# Patient Record
Sex: Female | Born: 1960 | Race: White | Hispanic: No | Marital: Married | Smoking: Former smoker
Health system: Southern US, Community
[De-identification: ages and names within clinical notes are randomized; demographics above are authoritative.]

## PROBLEM LIST (undated history)

## (undated) DIAGNOSIS — R000 Tachycardia, unspecified: Secondary | ICD-10-CM

## (undated) DIAGNOSIS — K859 Acute pancreatitis without necrosis or infection, unspecified: Secondary | ICD-10-CM

## (undated) DIAGNOSIS — I454 Nonspecific intraventricular block: Secondary | ICD-10-CM

---
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
## 2000-02-18 ENCOUNTER — Emergency Department (HOSPITAL_COMMUNITY): Admission: EM | Admit: 2000-02-18 | Discharge: 2000-02-18 | Payer: Self-pay | Admitting: Emergency Medicine

## 2004-12-24 ENCOUNTER — Emergency Department (HOSPITAL_COMMUNITY): Admission: EM | Admit: 2004-12-24 | Discharge: 2004-12-24 | Payer: Self-pay | Admitting: Emergency Medicine

## 2004-12-25 ENCOUNTER — Emergency Department (HOSPITAL_COMMUNITY): Admission: EM | Admit: 2004-12-25 | Discharge: 2004-12-25 | Payer: Self-pay | Admitting: Emergency Medicine

## 2005-06-04 HISTORY — PX: KNEE SURGERY: SHX244

## 2005-06-23 ENCOUNTER — Emergency Department: Payer: Self-pay | Admitting: Emergency Medicine

## 2005-06-24 ENCOUNTER — Other Ambulatory Visit: Payer: Self-pay

## 2005-06-25 ENCOUNTER — Ambulatory Visit: Payer: Self-pay | Admitting: Emergency Medicine

## 2005-11-20 ENCOUNTER — Ambulatory Visit: Payer: Self-pay | Admitting: Specialist

## 2005-12-14 ENCOUNTER — Ambulatory Visit (HOSPITAL_COMMUNITY): Admission: RE | Admit: 2005-12-14 | Discharge: 2005-12-14 | Payer: Self-pay | Admitting: Orthopaedic Surgery

## 2005-12-18 ENCOUNTER — Encounter: Payer: Self-pay | Admitting: Orthopaedic Surgery

## 2006-01-02 ENCOUNTER — Encounter: Payer: Self-pay | Admitting: Orthopaedic Surgery

## 2015-12-30 ENCOUNTER — Encounter: Payer: Self-pay | Admitting: Family Medicine

## 2015-12-30 ENCOUNTER — Ambulatory Visit (INDEPENDENT_AMBULATORY_CARE_PROVIDER_SITE_OTHER): Payer: BLUE CROSS/BLUE SHIELD | Admitting: Family Medicine

## 2015-12-30 VITALS — BP 120/84 | HR 70 | Temp 97.7°F | Resp 16 | Ht 66.0 in | Wt 227.4 lb

## 2015-12-30 DIAGNOSIS — S61259A Open bite of unspecified finger without damage to nail, initial encounter: Secondary | ICD-10-CM | POA: Diagnosis not present

## 2015-12-30 DIAGNOSIS — M26629 Arthralgia of temporomandibular joint, unspecified side: Secondary | ICD-10-CM | POA: Insufficient documentation

## 2015-12-30 DIAGNOSIS — E785 Hyperlipidemia, unspecified: Secondary | ICD-10-CM | POA: Insufficient documentation

## 2015-12-30 DIAGNOSIS — W540XXA Bitten by dog, initial encounter: Principal | ICD-10-CM

## 2015-12-30 DIAGNOSIS — K529 Noninfective gastroenteritis and colitis, unspecified: Secondary | ICD-10-CM | POA: Insufficient documentation

## 2015-12-30 DIAGNOSIS — Z23 Encounter for immunization: Secondary | ICD-10-CM | POA: Diagnosis not present

## 2015-12-30 MED ORDER — CEFTRIAXONE SODIUM 500 MG IJ SOLR
500.0000 mg | Freq: Once | INTRAMUSCULAR | Status: AC
  Administered 2015-12-30: 500 mg via INTRAMUSCULAR

## 2015-12-30 MED ORDER — AMOXICILLIN-POT CLAVULANATE 875-125 MG PO TABS: 1.0000 | ORAL_TABLET | Freq: Two times a day (BID) | ORAL | 0 refills | Status: DC

## 2015-12-30 NOTE — Patient Instructions (Addendum)
Continue salt water soaks. If not improving or you develop fever/chills over the next 24-48 hours report to the ER. You have received a Rocephin shot 1 gram today.

## 2015-12-30 NOTE — Progress Notes (Addendum)
Subjective:     Patient ID: Vanessa Rhodes, female   DOB: 1960-07-23, 55 y.o.   MRN: [id number]  HPI  Chief Complaint  Patient presents with  . Animal Bite    Patient comes in office today with concerns of dog bite to the right hand. Patient states that injury occured yesterday morning when she was trying to tell her dog "no" when he tried to eat her cats food. Patient reports that her index finger where bite was is swollen and draining, last report  Td/Tdap was 76yrs ago.   States she has soaked her finger in salt water and applied antibiotic cream with little improvement. Reports continued pain especially with flexion. She is right handed. Works as the Software engineer at [employer].   Review of Systems     Objective:   Physical Exam  Constitutional: She appears well-developed and well-nourished. No distress.  Skin:  Right third finger with laceration/bite over her PIP joint with erythema and swelling. Has puncture wound on the ventral aspect of this finger. Can flex finger to 45 degrees before increased pain and stiffness/ + full extension.       Assessment:    1. Dog bite of finger, initial encounter - amoxicillin-clavulanate (AUGMENTIN) 875-125 MG tablet; Take 1 tablet by mouth 2 (two) times daily.  Dispense: 20 tablet; Refill: 0 - cefTRIAXone (ROCEPHIN) injection 500 mg; Inject 500 mg into the muscle once. - cefTRIAXone (ROCEPHIN) injection 500 mg; Inject 500 mg into the muscle once.  2. Need for diphtheria-tetanus-pertussis (Tdap) vaccine - Tdap vaccine greater than or equal to 7yo IM    Plan:    She is to report to the ER if not improving in the next 24-48 hours.

## 2016-01-02 ENCOUNTER — Telehealth: Payer: Self-pay | Admitting: Family Medicine

## 2016-01-02 NOTE — Telephone Encounter (Signed)
Pt stated that she had received a message from [employer] asking how her wound from a dog bite was healing. Pt stated that there is still some puss coming out of the wound and it is still painful. Pt stated that it has improved some and she continues to take medication and care for the wound as directed. Pt stated to let her know if she should schedule a follow up visit. Please advise. Thanks TNP

## 2016-01-02 NOTE — Telephone Encounter (Signed)
Yes, come back tomorrow, 8/1.

## 2016-01-02 NOTE — Telephone Encounter (Signed)
Called pt to schedule appt with Nadine Counts tomorrow. No answer. LMTCB. Thanks TNP

## 2016-01-02 NOTE — Telephone Encounter (Signed)
Pt returned call and scheduled appt for tomorrow at 8:30am. Thanks TNP

## 2016-01-03 ENCOUNTER — Telehealth: Payer: Self-pay

## 2016-01-03 ENCOUNTER — Ambulatory Visit (INDEPENDENT_AMBULATORY_CARE_PROVIDER_SITE_OTHER): Payer: BLUE CROSS/BLUE SHIELD | Admitting: Family Medicine

## 2016-01-03 ENCOUNTER — Encounter: Payer: Self-pay | Admitting: Family Medicine

## 2016-01-03 ENCOUNTER — Ambulatory Visit
Admission: RE | Admit: 2016-01-03 | Discharge: 2016-01-03 | Disposition: A | Discharge status: Home / Self Care | Payer: BLUE CROSS/BLUE SHIELD | Source: Ambulatory Visit | Attending: Family Medicine | Admitting: Family Medicine

## 2016-01-03 VITALS — BP 126/80 | HR 82 | Temp 97.9°F | Resp 16 | Wt 227.8 lb

## 2016-01-03 DIAGNOSIS — X58XXXD Exposure to other specified factors, subsequent encounter: Secondary | ICD-10-CM | POA: Diagnosis not present

## 2016-01-03 DIAGNOSIS — S61259D Open bite of unspecified finger without damage to nail, subsequent encounter: Secondary | ICD-10-CM

## 2016-01-03 DIAGNOSIS — W540XXD Bitten by dog, subsequent encounter: Principal | ICD-10-CM

## 2016-01-03 NOTE — Telephone Encounter (Signed)
[id number] 

## 2016-01-03 NOTE — Patient Instructions (Signed)
Continue salt water soaks and dressing. We will call you with the x-ray report,

## 2016-01-03 NOTE — Progress Notes (Signed)
Subjective:     Patient ID: Vanessa Rhodes, female   DOB: July 25, 1960, 55 y.o.   MRN: [id number]  HPI  Chief Complaint  Patient presents with  . Animal Bite    Patient returns to office today for follow up visit, patient was last seen 7/28 for dog bite to her right hand (middle finger). Patient was prescribed Augmentin 875-125 she reports good compliance and fair tolerance on medication, today patient reports that her finger is still painful and has white drainage.   States she removes the dressing at night.   Review of Systems     Objective:   Physical Exam  Constitutional: She appears well-developed and well-nourished. No distress.  Skin:  Right middle finger over her PIP with less swelling, decreased erythema, and no drainage noted.       Assessment:    1. Dog bite of finger, subsequent encounter:skin improving but due to continued pain will check for signs of osteomyelitis - DG Finger Middle Right; Future    Plan:    Continue current treatment with salt water soaks, dressings, and abx pending x-ray report.

## 2016-01-03 NOTE — Telephone Encounter (Signed)
Patient has been advised. KW 

## 2016-01-03 NOTE — Telephone Encounter (Signed)
-----   Message from Anola Gurney, [place] sent at 01/03/2016  9:45 AM EDT ----- X-ray ok. Continue current treatment plan

## 2016-01-20 ENCOUNTER — Ambulatory Visit (INDEPENDENT_AMBULATORY_CARE_PROVIDER_SITE_OTHER): Payer: BLUE CROSS/BLUE SHIELD | Admitting: Family Medicine

## 2016-01-20 ENCOUNTER — Encounter: Payer: Self-pay | Admitting: Family Medicine

## 2016-01-20 VITALS — BP 130/80 | HR 76 | Temp 97.4°F | Resp 16 | Wt 227.6 lb

## 2016-01-20 DIAGNOSIS — M545 Low back pain, unspecified: Secondary | ICD-10-CM

## 2016-01-20 DIAGNOSIS — R220 Localized swelling, mass and lump, head: Secondary | ICD-10-CM | POA: Diagnosis not present

## 2016-01-20 DIAGNOSIS — R42 Dizziness and giddiness: Secondary | ICD-10-CM

## 2016-01-20 DIAGNOSIS — Z131 Encounter for screening for diabetes mellitus: Secondary | ICD-10-CM

## 2016-01-20 DIAGNOSIS — E785 Hyperlipidemia, unspecified: Secondary | ICD-10-CM | POA: Diagnosis not present

## 2016-01-20 DIAGNOSIS — Z8349 Family history of other endocrine, nutritional and metabolic diseases: Secondary | ICD-10-CM

## 2016-01-20 NOTE — Progress Notes (Signed)
Subjective:     Patient ID: Vanessa Rhodes, female   DOB: 08/20/1960, 55 y.o.   MRN: [id number]  HPI  Chief Complaint  Patient presents with  . Fatigue    Patient comes in office today with concerns of feeling week and having dizzy spells upon standing. Patient states that since Sunday she has noticed swelling of the tongue that has been persistant. Patient request lab work today to check her cholesterol level and thyroid level.   States she had vertigo 5 days ago when she got out of bed but it has resolved over the course of the week. She wishes to start catching up on her health maintenance.   Review of Systems  HENT:       States her tongue has intermittently felt swollen with areas of tenderness/rough texture. Reports chronic tinnitus  Gastrointestinal:       No hx of colonoscopy  Genitourinary:       Reports normal Pap smear 4-5 years ago. Mammogram at about that time as well.  Musculoskeletal: Positive for back pain.       Reports lumbar back pain when she first gets up. States it progressively gets better over the course of the day. Reports she will see her orthopedic surgeon in [place] if it worsens. Currently using two Advil in the AM.       Objective:   Physical Exam  Constitutional: She appears well-developed and well-nourished. No distress.  HENT:  Right Ear: Tympanic membrane normal.  Left Ear: Tympanic membrane normal.  Tongue has white coating with right lateral aspect with smooth denuded area  Eyes: EOM are normal. Pupils are equal, round, and reactive to light.  Cardiovascular: Normal rate and regular rhythm.   Pulmonary/Chest: Breath sounds normal.  Musculoskeletal: She exhibits no edema (of lower extremties).       Assessment:    1. Vertigo: spontaneously improving  2. HLD (hyperlipidemia) - Lipid panel  3. Tongue swelling - B12 - Folate - CBC with Differential/Platelet  4. Family history of hypothyroidism - T4, free - TSH  5. Screening for  diabetes mellitus - Comprehensive metabolic panel  6. Bilateral low back pain without sciatica    Plan:    Encouraged exercise and mammogram. Further f/u pending lab results.

## 2016-01-20 NOTE — Patient Instructions (Signed)
Consider updating mammogram at [facility]. We will call you with the lab results. Consider a walking program for 30 minutes daily.

## 2016-01-26 ENCOUNTER — Telehealth: Payer: Self-pay | Admitting: Family Medicine

## 2016-01-26 LAB — CBC WITH DIFFERENTIAL/PLATELET
BASOS ABS: 0 10*3/uL (ref 0.0–0.2)
BASOS: 0 %
EOS (ABSOLUTE): 0.1 10*3/uL (ref 0.0–0.4)
Eos: 2 %
Hematocrit: 45.2 % (ref 34.0–46.6)
Hemoglobin: 15.2 g/dL (ref 11.1–15.9)
IMMATURE GRANS (ABS): 0 10*3/uL (ref 0.0–0.1)
IMMATURE GRANULOCYTES: 0 %
LYMPHS: 48 %
Lymphocytes Absolute: 3 10*3/uL (ref 0.7–3.1)
MCH: 30.6 pg (ref 26.6–33.0)
MCHC: 33.6 g/dL (ref 31.5–35.7)
MCV: 91 fL (ref 79–97)
MONOCYTES: 6 %
Monocytes Absolute: 0.4 10*3/uL (ref 0.1–0.9)
NEUTROS PCT: 44 %
Neutrophils Absolute: 2.8 10*3/uL (ref 1.4–7.0)
PLATELETS: 218 10*3/uL (ref 150–379)
RBC: 4.97 x10E6/uL (ref 3.77–5.28)
RDW: 13.5 % (ref 12.3–15.4)
WBC: 6.4 10*3/uL (ref 3.4–10.8)

## 2016-01-26 LAB — VITAMIN B12: VITAMIN B 12: 436 pg/mL (ref 211–946)

## 2016-01-26 LAB — COMPREHENSIVE METABOLIC PANEL
ALK PHOS: 58 IU/L (ref 39–117)
ALT: 123 IU/L — AB (ref 0–32)
AST: 90 IU/L — AB (ref 0–40)
Albumin/Globulin Ratio: 2 (ref 1.2–2.2)
Albumin: 4.4 g/dL (ref 3.5–5.5)
BUN/Creatinine Ratio: 19 (ref 9–23)
BUN: 13 mg/dL (ref 6–24)
Bilirubin Total: 0.6 mg/dL (ref 0.0–1.2)
CALCIUM: 9.7 mg/dL (ref 8.7–10.2)
CO2: 23 mmol/L (ref 18–29)
CREATININE: 0.7 mg/dL (ref 0.57–1.00)
Chloride: 102 mmol/L (ref 96–106)
GFR calc Af Amer: 111 mL/min/{1.73_m2} (ref >59)
GFR calc non Af Amer: 96 mL/min/{1.73_m2} (ref >59)
GLOBULIN, TOTAL: 2.2 g/dL (ref 1.5–4.5)
GLUCOSE: 102 mg/dL — AB (ref 65–99)
Potassium: 4.2 mmol/L (ref 3.5–5.2)
SODIUM: 141 mmol/L (ref 134–144)
Total Protein: 6.6 g/dL (ref 6.0–8.5)

## 2016-01-26 LAB — T4, FREE: Free T4: 1.35 ng/dL (ref 0.82–1.77)

## 2016-01-26 LAB — LIPID PANEL
CHOLESTEROL TOTAL: 219 mg/dL — AB (ref 100–199)
Chol/HDL Ratio: 4.1 ratio units (ref 0.0–4.4)
HDL: 53 mg/dL (ref >39)
LDL CALC: 129 mg/dL — AB (ref 0–99)
TRIGLYCERIDES: 183 mg/dL — AB (ref 0–149)
VLDL Cholesterol Cal: 37 mg/dL (ref 5–40)

## 2016-01-26 LAB — TSH: TSH: 2.93 u[IU]/mL (ref 0.450–4.500)

## 2016-01-26 LAB — FOLATE: Folate: 14.7 ng/mL (ref >3.0)

## 2016-01-26 NOTE — Telephone Encounter (Signed)
Did you contact Ms. Renae GlossShelton?KW

## 2016-01-26 NOTE — Telephone Encounter (Signed)
Pt is returning a call to Charlotte HallBob.  CB#[phone]/MW

## 2016-02-27 ENCOUNTER — Ambulatory Visit (INDEPENDENT_AMBULATORY_CARE_PROVIDER_SITE_OTHER): Payer: BLUE CROSS/BLUE SHIELD | Admitting: Family Medicine

## 2016-02-27 ENCOUNTER — Other Ambulatory Visit: Payer: Self-pay | Admitting: Family Medicine

## 2016-02-27 DIAGNOSIS — Z23 Encounter for immunization: Secondary | ICD-10-CM

## 2016-02-27 DIAGNOSIS — R748 Abnormal levels of other serum enzymes: Secondary | ICD-10-CM

## 2016-02-29 ENCOUNTER — Other Ambulatory Visit: Payer: Self-pay | Admitting: Family Medicine

## 2016-03-01 ENCOUNTER — Telehealth: Payer: Self-pay

## 2016-03-01 LAB — HEPATIC FUNCTION PANEL
ALT: 93 IU/L — AB (ref 0–32)
AST: 51 IU/L — ABNORMAL HIGH (ref 0–40)
Albumin: 4.6 g/dL (ref 3.5–5.5)
Alkaline Phosphatase: 57 IU/L (ref 39–117)
Bilirubin Total: 0.3 mg/dL (ref 0.0–1.2)
Bilirubin, Direct: 0.11 mg/dL (ref 0.00–0.40)
Total Protein: 6.7 g/dL (ref 6.0–8.5)

## 2016-03-01 NOTE — Telephone Encounter (Signed)
Let's repeat labs in 4 weeks. Call for lab slip before you come in. If continued elevated will proceed with further evaluation.

## 2016-03-01 NOTE — Telephone Encounter (Signed)
Spoke with patient on the phone and advised her of lab report she states that she rarely drinks alcohol and wasn't sure if her recent antibiotics and prednisone could cause for abnormality in test? KW

## 2016-03-01 NOTE — Telephone Encounter (Signed)
Patient has been advised. KW 

## 2016-03-01 NOTE — Telephone Encounter (Signed)
-----   Message from Anola Gurneyobert Chauvin, [place] sent at 03/01/2016  7:47 AM EDT ----- Liver tests improving. How often do you drink alcohol and how much at a time?

## 2016-05-09 DIAGNOSIS — L439 Lichen planus, unspecified: Secondary | ICD-10-CM | POA: Insufficient documentation

## 2016-05-09 DIAGNOSIS — K1379 Other lesions of oral mucosa: Secondary | ICD-10-CM | POA: Insufficient documentation

## 2018-01-27 IMAGING — CR DG FINGER MIDDLE 2+V*R*
1 series · 3 of 3 positions shown · non-contrast
Comparison: None.

CLINICAL DATA: Recent dog bite with pain

EXAM:
RIGHT MIDDLE FINGER 2+V

[Series 1: dg finger middle right · 0.14mm/px · 3 of 3 slices shown]
[im 1/3]
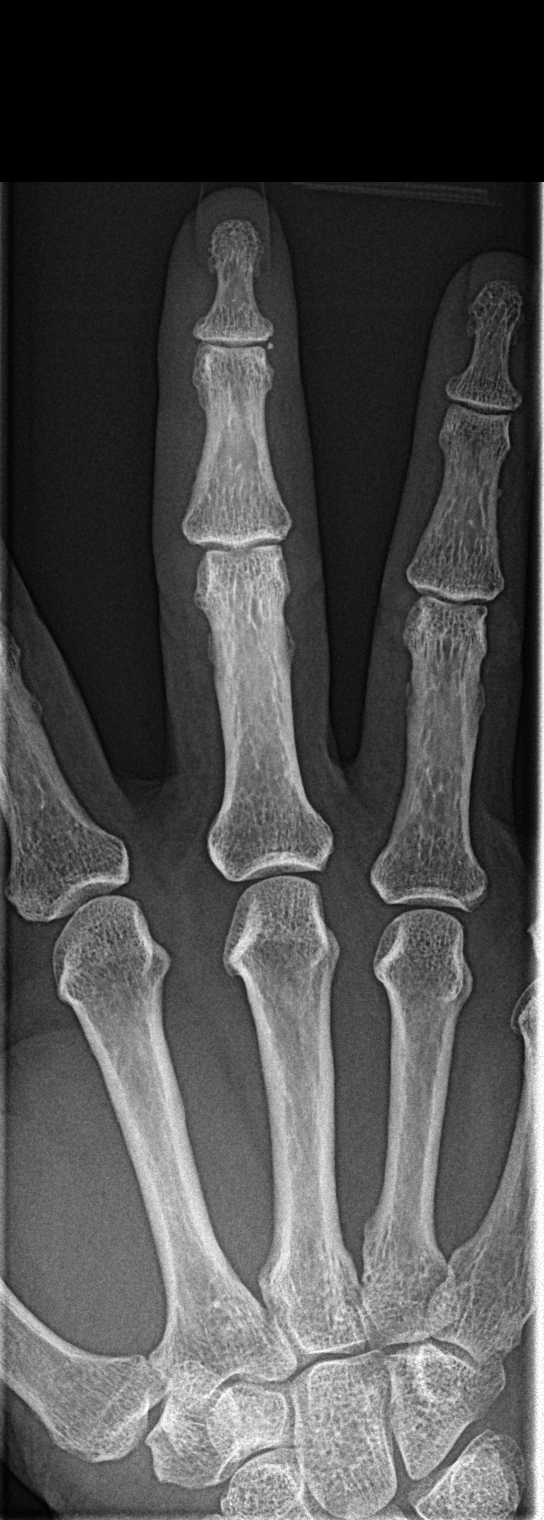
[im 2/3]
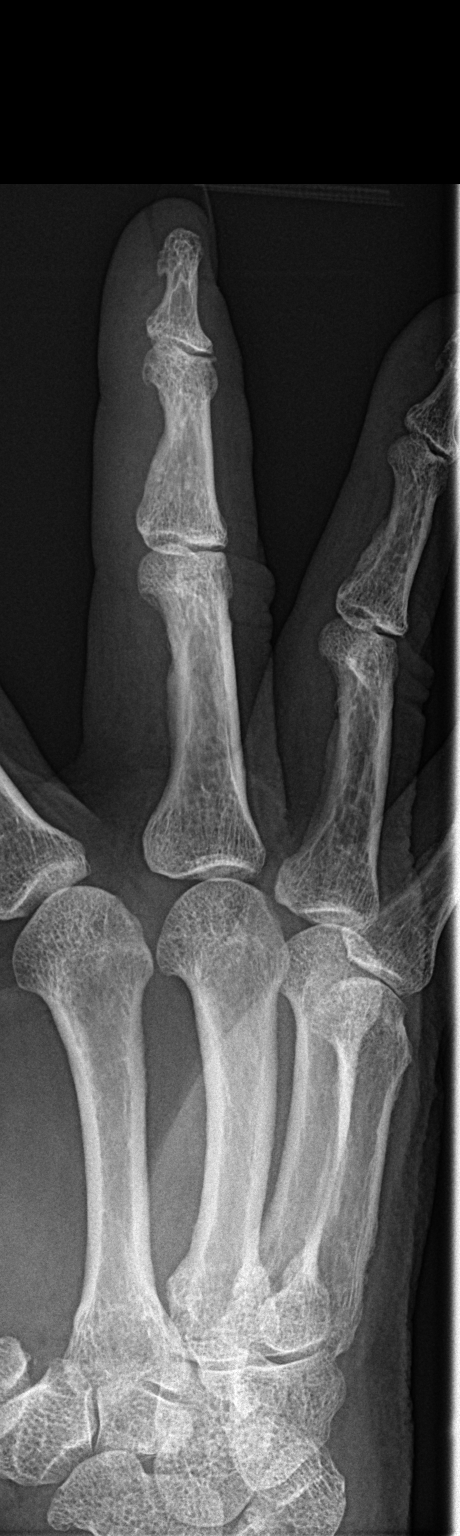
[im 3/3]
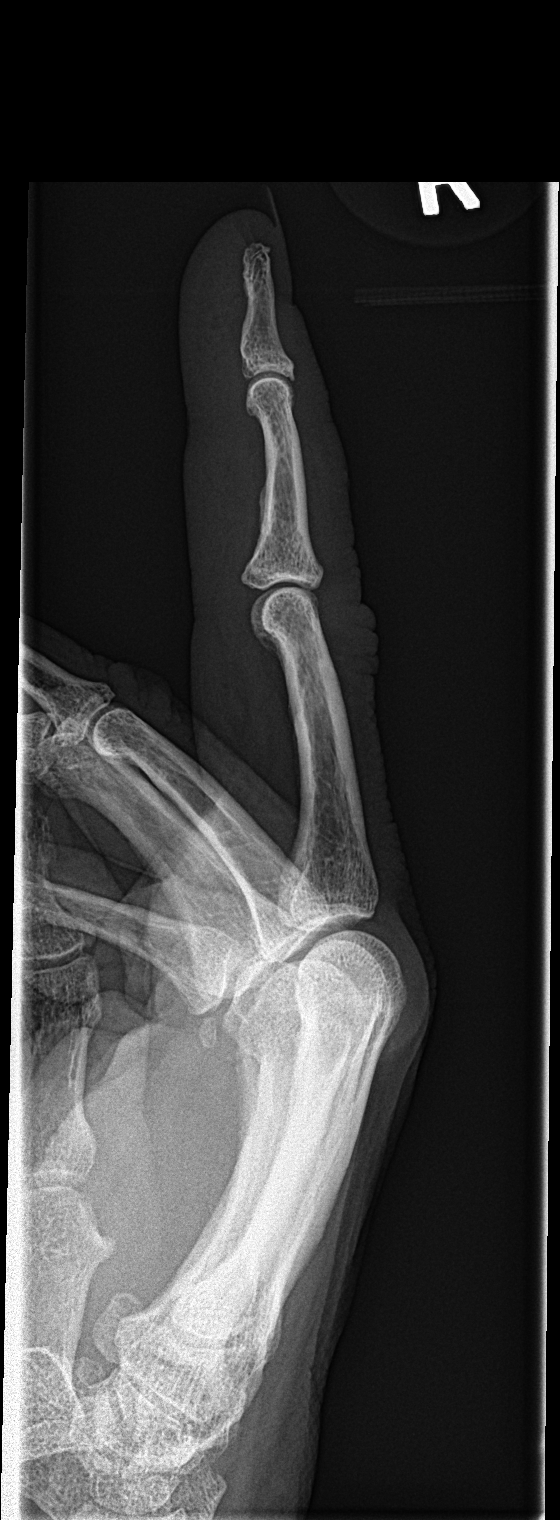

[3 of 3 positions shown; findings below may reference images not displayed]

FINDINGS: Mild soft tissue swelling is noted. No bony erosion to suggest
osteomyelitis is noted. Tiny bony densities noted adjacent to the
DIP joint likely related to prior trauma. No new focal abnormality
is seen.
IMPRESSION: No acute abnormality noted.

## 2019-04-14 ENCOUNTER — Ambulatory Visit
Admission: EM | Admit: 2019-04-14 | Discharge: 2019-04-14 | Disposition: A | Discharge status: Home / Self Care | Payer: Self-pay | Attending: Emergency Medicine | Admitting: Emergency Medicine

## 2019-04-14 ENCOUNTER — Encounter: Payer: Self-pay | Admitting: *Deleted

## 2019-04-14 DIAGNOSIS — J0100 Acute maxillary sinusitis, unspecified: Secondary | ICD-10-CM

## 2019-04-14 DIAGNOSIS — R030 Elevated blood-pressure reading, without diagnosis of hypertension: Secondary | ICD-10-CM

## 2019-04-14 MED ORDER — AMOXICILLIN 500 MG PO TABS: 500.0000 mg | ORAL_TABLET | Freq: Three times a day (TID) | ORAL | 0 refills | Status: DC

## 2019-04-14 MED ORDER — GUAIFENESIN ER 600 MG PO TB12: 1200.0000 mg | ORAL_TABLET | Freq: Two times a day (BID) | ORAL | 0 refills | Status: DC | PRN

## 2019-04-14 NOTE — Discharge Instructions (Addendum)
Take the antibiotic amoxicillin and Mucinex as directed.    Stop taking the over-the-counter cold medication.    Follow-up with your primary care provider or return here if your symptoms are not improving.  Your blood pressure is elevated today at 171/104; rechecked at 156/92.  Please have this rechecked by your primary care provider in 2 weeks.    Your COVID test is pending.  You should self quarantine until your test result is back and is negative.    Go to the emergency department if you develop high fever, shortness of breath, severe diarrhea, or other concerning symptoms.

## 2019-04-14 NOTE — ED Triage Notes (Signed)
Patient reports nasal pressure around eyes and sinus headache intermittently x 2 weeks. Patient has tried OTC meds with no refill. No fevers.

## 2019-04-14 NOTE — ED Notes (Signed)
Patient states her BP is normally 395 systolic. Patient has been taken a lot of OTC meds.

## 2019-04-14 NOTE — ED Provider Notes (Signed)
[facility]    CSN: [id number] Arrival date & time:         History   Chief Complaint Chief Complaint  Patient presents with  . Sinusitis    HPI Vanessa Rhodes is a 58 y.o. female.   Patient presents with sinus pressure and headache x2 weeks.  She has taken several OTC sinus medications without relief.  She denies fever, chills, sore throat, ear pain, cough, shortness of breath, vomiting, diarrhea, rash, or other symptoms.  The history is provided by the patient.    History reviewed. No pertinent past medical history.  Patient Active Problem List   Diagnosis Date Noted  . HLD (hyperlipidemia) 12/30/2015  . Arthralgia of temporomandibular joint 12/30/2015    Past Surgical History:  Procedure Laterality Date  . KNEE SURGERY Left 2007  . TUBAL LIGATION  1979    OB History   No obstetric history on file.      Home Medications    Prior to Admission medications   Medication Sig Start Date End Date Taking? Authorizing Provider  amoxicillin (AMOXIL) 500 MG tablet Take 1 tablet (500 mg total) by mouth 3 (three) times daily. 04/14/19   Sharion Balloon, NP  guaiFENesin (MUCINEX) 600 MG 12 hr tablet Take 2 tablets (1,200 mg total) by mouth 2 (two) times daily as needed. 04/14/19   Sharion Balloon, NP    Family History Family History  Problem Relation Age of Onset  . Pulmonary disease Father     Social History Social History   Tobacco Use  . Smoking status: Former Smoker    Types: Cigarettes  . Smokeless tobacco: Former Network engineer Use Topics  . Alcohol use: Yes    Comment: occasional   . Drug use: No     Allergies   Patient has no known allergies.   Review of Systems Review of Systems  Constitutional: Negative for chills and fever.  HENT: Positive for sinus pressure and sinus pain. Negative for ear pain and sore throat.   Eyes: Negative for pain and visual disturbance.  Respiratory: Negative for cough and shortness of breath.    Cardiovascular: Negative for chest pain and palpitations.  Gastrointestinal: Negative for abdominal pain, diarrhea and vomiting.  Genitourinary: Negative for dysuria and hematuria.  Musculoskeletal: Negative for arthralgias and back pain.  Skin: Negative for color change and rash.  Neurological: Negative for seizures and syncope.  All other systems reviewed and are negative.    Physical Exam Triage Vital Signs ED Triage Vitals [04/14/19 1450]  Enc Vitals Group     BP      Pulse      Resp      Temp      Temp src      SpO2      Weight      Height      Head Circumference      Peak Flow      Pain Score 8     Pain Loc      Pain Edu?      Excl. in [place]?    No data found.  Updated Vital Signs BP (!) 156/92 (BP Location: Left Arm) Comment: large cuff  Pulse 76   Temp 98.8 F (37.1 C) (Oral)   Resp 16   SpO2 95%   Visual Acuity Right Eye Distance:   Left Eye Distance:   Bilateral Distance:    Right Eye Near:   Left Eye Near:  Bilateral Near:     Physical Exam Vitals signs and nursing note reviewed.  Constitutional:      General: She is not in acute distress.    Appearance: She is well-developed.  HENT:     Head: Normocephalic and atraumatic.     Right Ear: Tympanic membrane normal.     Left Ear: Tympanic membrane normal.     Nose:     Comments: Bilateral frontal and maxillary sinuses tender to palpation, L>R.      Mouth/Throat:     Mouth: Mucous membranes are moist.     Pharynx: Oropharynx is clear.  Eyes:     Conjunctiva/sclera: Conjunctivae normal.  Neck:     Musculoskeletal: Neck supple.  Cardiovascular:     Rate and Rhythm: Normal rate and regular rhythm.     Heart sounds: No murmur.  Pulmonary:     Effort: Pulmonary effort is normal. No respiratory distress.     Breath sounds: Normal breath sounds.  Abdominal:     General: Bowel sounds are normal.     Palpations: Abdomen is soft.     Tenderness: There is no abdominal tenderness. There is no  guarding or rebound.  Skin:    General: Skin is warm and dry.     Findings: No rash.  Neurological:     General: No focal deficit present.     Mental Status: She is alert and oriented to person, place, and time.      UC Treatments / Results  Labs (all labs ordered are listed, but only abnormal results are displayed) Labs Reviewed  NOVEL CORONAVIRUS, NAA    EKG   Radiology No results found.  Procedures Procedures (including critical care time)  Medications Ordered in UC Medications - No data to display  Initial Impression / Assessment and Plan / UC Course  I have reviewed the triage vital signs and the nursing notes.  Pertinent labs & imaging results that were available during my care of the patient were reviewed by me and considered in my medical decision making (see chart for details).    Acute sinusitis.  Treating with amoxicillin.  Also treating with Mucinex.  Instructed patient to stop taking OTC cold medications as these may be contributing to her elevated blood pressure.  Discussed with patient that her blood pressure is elevated today needs to be rechecked by her PCP in the next 2 to 4 weeks.  Instructed patient to follow-up with her PCP or return here for symptoms or not improving. COVID test performed here.  Instructed patient to self quarantine until the test result is back.  Instructed patient to go to the emergency department if she develops high fever, shortness of breath, severe diarrhea, or other concerning symptoms.  Patient agrees with plan of care.   Final Clinical Impressions(s) / UC Diagnoses   Final diagnoses:  Acute non-recurrent maxillary sinusitis  Elevated blood pressure reading     Discharge Instructions     Take the antibiotic amoxicillin and Mucinex as directed.    Stop taking the over-the-counter cold medication.    Follow-up with your primary care provider or return here if your symptoms are not improving.  Your blood pressure is  elevated today at 171/104; rechecked at 156/92.  Please have this rechecked by your primary care provider in 2 weeks.    Your COVID test is pending.  You should self quarantine until your test result is back and is negative.    Go to the emergency department  if you develop high fever, shortness of breath, severe diarrhea, or other concerning symptoms.       ED Prescriptions    Medication Sig Dispense Auth. Provider   amoxicillin (AMOXIL) 500 MG tablet Take 1 tablet (500 mg total) by mouth 3 (three) times daily. 21 tablet Mickie Bailate, Traveion Ruddock H, NP   guaiFENesin (MUCINEX) 600 MG 12 hr tablet Take 2 tablets (1,200 mg total) by mouth 2 (two) times daily as needed. 12 tablet Mickie Bailate, Matilde Pottenger H, NP     PDMP not reviewed this encounter.   Mickie Bailate, Achol Azpeitia H, NP 04/14/19 1525

## 2019-04-17 LAB — NOVEL CORONAVIRUS, NAA: SARS-CoV-2, NAA: NOT DETECTED

## 2021-10-29 DIAGNOSIS — R69 Illness, unspecified: Secondary | ICD-10-CM | POA: Diagnosis not present

## 2021-10-29 DIAGNOSIS — I10 Essential (primary) hypertension: Secondary | ICD-10-CM | POA: Diagnosis not present

## 2021-10-29 DIAGNOSIS — Z13228 Encounter for screening for other metabolic disorders: Secondary | ICD-10-CM | POA: Diagnosis not present

## 2021-10-29 DIAGNOSIS — Z0001 Encounter for general adult medical examination with abnormal findings: Secondary | ICD-10-CM | POA: Diagnosis not present

## 2021-10-29 DIAGNOSIS — Z1389 Encounter for screening for other disorder: Secondary | ICD-10-CM | POA: Diagnosis not present

## 2021-10-29 DIAGNOSIS — Z6836 Body mass index (BMI) 36.0-36.9, adult: Secondary | ICD-10-CM | POA: Diagnosis not present

## 2021-11-07 ENCOUNTER — Encounter: Payer: Self-pay | Admitting: Physician Assistant

## 2021-11-07 ENCOUNTER — Ambulatory Visit (INDEPENDENT_AMBULATORY_CARE_PROVIDER_SITE_OTHER): Payer: 59 | Admitting: Physician Assistant

## 2021-11-07 VITALS — BP 134/74 | HR 63 | Temp 97.7°F | Ht 64.0 in | Wt 208.0 lb

## 2021-11-07 DIAGNOSIS — F419 Anxiety disorder, unspecified: Secondary | ICD-10-CM | POA: Insufficient documentation

## 2021-11-07 DIAGNOSIS — I10 Essential (primary) hypertension: Secondary | ICD-10-CM

## 2021-11-07 DIAGNOSIS — Z7689 Persons encountering health services in other specified circumstances: Secondary | ICD-10-CM

## 2021-11-07 DIAGNOSIS — J3489 Other specified disorders of nose and nasal sinuses: Secondary | ICD-10-CM

## 2021-11-07 DIAGNOSIS — R69 Illness, unspecified: Secondary | ICD-10-CM | POA: Diagnosis not present

## 2021-11-07 MED ORDER — PREDNISONE 20 MG PO TABS: ORAL_TABLET | ORAL | 0 refills | Status: DC

## 2021-11-07 MED ORDER — LISINOPRIL 10 MG PO TABS: 10.0000 mg | ORAL_TABLET | Freq: Every day | ORAL | 0 refills | Status: DC

## 2021-11-07 MED ORDER — SERTRALINE HCL 25 MG PO TABS: 25.0000 mg | ORAL_TABLET | Freq: Every day | ORAL | 0 refills | Status: DC

## 2021-11-07 NOTE — Progress Notes (Signed)
New Patient Office Visit  Subjective    Patient ID: Vanessa Rhodes, female    DOB: 22-Jun-1960  Age: 61 y.o. MRN: [id number]  CC:  Chief Complaint  Patient presents with   Hypertension    HPI Vanessa CUTILLO presents to establish care. Patient reports remote hx of high blood pressure which she was trying to manage on her own but over memorial weekend did not feel well and went to urgent care where they started her on Lisinopril 5 mg. States has been checking blood pressure at home with highest reading being 158/87 and lowest 121/62. No chest pain, palpitations, shortness of breath, or dizziness. Does reports feeling left sided pressure, worsening left ear ringing, and postnasal drainage x 1-2 weeks.  No headache, fever, sore throat, or cold chills. Patent denies high stress levels. Reports is a constant worries and does report feeling anxious. Does not feel depressed. Does reports was prescribed something for anxiety when she went to UC but does not recall medication name and did not get it because it was out-of-stock and also expensive. Patient also reports remote hx of migraine and lichen planus- dx by ENT.    Outpatient Encounter Medications as of 11/07/2021  Medication Sig   lisinopril (ZESTRIL) 10 MG tablet Take 1 tablet (10 mg total) by mouth daily.   predniSONE (DELTASONE) 20 MG tablet Take 2 tablets by mouth x 2 days, 1 tablets x 2 days, 0.5 tablet x 2 days   sertraline (ZOLOFT) 25 MG tablet Take 1 tablet (25 mg total) by mouth daily.   [DISCONTINUED] lisinopril (ZESTRIL) 5 MG tablet Take 5 mg by mouth daily.   [DISCONTINUED] amoxicillin (AMOXIL) 500 MG tablet Take 1 tablet (500 mg total) by mouth 3 (three) times daily.   [DISCONTINUED] guaiFENesin (MUCINEX) 600 MG 12 hr tablet Take 2 tablets (1,200 mg total) by mouth 2 (two) times daily as needed.   No facility-administered encounter medications on file as of 11/07/2021.    History reviewed. No pertinent past medical  history.  Past Surgical History:  Procedure Laterality Date   KNEE SURGERY Left 2007   TUBAL LIGATION  1979    Family History  Problem Relation Age of Onset   Pulmonary disease Father     Social History   Socioeconomic History   Marital status: Married    Spouse name: Vanessa Rhodes   Number of children: 2   Years of education: GED   Highest education level: Not on file  Occupational History   Occupation: RETIRED  Tobacco Use   Smoking status: Former    Types: Cigarettes    Quit date: 06/04/2004    Years since quitting: 17.4   Smokeless tobacco: Former  Building services engineer Use: Never used  Substance and Sexual Activity   Alcohol use: Yes    Comment: occasional    Drug use: No   Sexual activity: Yes    Partners: Male    Birth control/protection: None  Other Topics Concern   Not on file  Social History Narrative   Not on file   Social Determinants of Health   Financial Resource Strain: Not on file  Food Insecurity: Not on file  Transportation Needs: Not on file  Physical Activity: Not on file  Stress: Not on file  Social Connections: Not on file  Intimate Partner Violence: Not on file      11/07/2021    9:53 AM  Depression screen PHQ 2/9  Decreased Interest 1  Down, Depressed, Hopeless 1  PHQ - 2 Score 2  Altered sleeping 2  Tired, decreased energy 1  Change in appetite 1  Feeling bad or failure about yourself  1  Trouble concentrating 2  Moving slowly or fidgety/restless 0  Suicidal thoughts 1  PHQ-9 Score 10  Difficult doing work/chores Somewhat difficult      11/07/2021    9:53 AM  GAD 7 : Generalized Anxiety Score  Nervous, Anxious, on Edge 1  Control/stop worrying 1  Worry too much - different things 1  Trouble relaxing 1  Restless 1  Easily annoyed or irritable 1  Afraid - awful might happen 1  Total GAD 7 Score 7  Anxiety Difficulty Not difficult at all      ROS Review of Systems:  A fourteen system review of systems was  performed and found to be positive as per HPI.    Objective    BP 134/74   Pulse 63   Temp 97.7 F (36.5 C)   Ht 5\' 4"  (1.626 m)   Wt 208 lb (94.3 kg)   SpO2 97%   BMI 35.70 kg/m   Physical Exam General:  Pleasant and cooperative, appropriate for stated age.  Neuro:  Alert and oriented,  extra-ocular muscles intact  HEENT:  Normocephalic, atraumatic, PERRL, no erythema of bilateral TM's, there is some swelling of left TM, no swelling of right TM, boggy turbinates, mild erythema of posterior oropharynx, neck supple, no adenopathy   Skin:  no gross rash, warm, pink. Cardiac:  RRR, S1 S2 Respiratory: CTA B/L w/o wheezing, crackles or rales.  Vascular:  Ext warm, no cyanosis apprec.; cap RF less 2 sec. Psych:  No HI/SI, judgement and insight good, Euthymic mood. Full Affect.      Assessment & Plan:   Problem List Items Addressed This Visit       Cardiovascular and Mediastinum   Primary hypertension - Primary    -Discussed with patient increasing Lisinopril to 10 mg to better control BP. Patient is agreeable. Recommend to continue ambulatory BP monitoring and check BP at least 2 hours after taking medication. Keep a log to bring to follow-up visit. Will collect CMP at follow-up visit and request recent labs done at urgent care. Recommend to continue to limit salt intake. Will reassess BP and medication therapy in 4 weeks.        Relevant Medications   lisinopril (ZESTRIL) 10 MG tablet     Other   Anxiety    -Discussed with patient management options and agreeable to start medication therapy. Patient is able to contract for safety. Will start low dose of sertraline 25 mg daily. Discussed potential side effects and advised to let me know if unable to tolerate medication. Will reassess mood and medication therapy in 4 weeks.        Relevant Medications   sertraline (ZOLOFT) 25 MG tablet   Other Visit Diagnoses     Sinus pressure       Relevant Medications   predniSONE  (DELTASONE) 20 MG tablet   Encounter to establish care          Sinus pressure: -Discussed with patient has no s/sx consistent with bacterial infection. Will send prescription for prednisone taper to help improve pressure and congestion. Also recommend decongestant such as mucinex.    Return in about 4 weeks (around 12/05/2021) for HTN, Mood.   02/05/2022, PA-C

## 2021-11-07 NOTE — Assessment & Plan Note (Signed)
-  Discussed with patient increasing Lisinopril to 10 mg to better control BP. Patient is agreeable. Recommend to continue ambulatory BP monitoring and check BP at least 2 hours after taking medication. Keep a log to bring to follow-up visit. Will collect CMP at follow-up visit and request recent labs done at urgent care. Recommend to continue to limit salt intake. Will reassess BP and medication therapy in 4 weeks.

## 2021-11-07 NOTE — Patient Instructions (Signed)

## 2021-11-07 NOTE — Assessment & Plan Note (Signed)
-  Discussed with patient management options and agreeable to start medication therapy. Patient is able to contract for safety. Will start low dose of sertraline 25 mg daily. Discussed potential side effects and advised to let me know if unable to tolerate medication. Will reassess mood and medication therapy in 4 weeks.

## 2021-11-29 ENCOUNTER — Encounter: Payer: Self-pay | Admitting: Physician Assistant

## 2021-11-29 ENCOUNTER — Ambulatory Visit (INDEPENDENT_AMBULATORY_CARE_PROVIDER_SITE_OTHER): Payer: 59 | Admitting: Physician Assistant

## 2021-11-29 VITALS — BP 129/72 | HR 77 | Temp 97.7°F | Ht 64.0 in | Wt 206.0 lb

## 2021-11-29 DIAGNOSIS — F419 Anxiety disorder, unspecified: Secondary | ICD-10-CM | POA: Diagnosis not present

## 2021-11-29 DIAGNOSIS — I10 Essential (primary) hypertension: Secondary | ICD-10-CM | POA: Diagnosis not present

## 2021-11-29 DIAGNOSIS — R69 Illness, unspecified: Secondary | ICD-10-CM | POA: Diagnosis not present

## 2021-11-29 MED ORDER — SERTRALINE HCL 50 MG PO TABS: 50.0000 mg | ORAL_TABLET | Freq: Every day | ORAL | 0 refills | Status: DC

## 2021-11-29 MED ORDER — LISINOPRIL 10 MG PO TABS: 10.0000 mg | ORAL_TABLET | Freq: Every day | ORAL | 1 refills | Status: DC

## 2021-11-29 NOTE — Assessment & Plan Note (Addendum)
-  BP has improved. Will continue Lisinopril 10 mg daily. Will continue to monitor. Recommend collecting CMP at f/up visit. Patient reports recently had labs with UC, have not received requested records yet.

## 2021-11-29 NOTE — Assessment & Plan Note (Signed)
-  Discussed with patient increasing sertraline to 50 mg daily to help improve mood. Patient is agreeable. Advised to let me know if unable to tolerate increased dose or ineffective. Will continue to monitor.

## 2021-11-29 NOTE — Patient Instructions (Signed)

## 2021-11-29 NOTE — Progress Notes (Signed)
Established patient visit   Patient: Vanessa Rhodes   DOB: 05/13/1961   61 y.o. Female  MRN: [id number] Visit Date: 11/29/2021  Chief Complaint  Patient presents with   Follow-up   Subjective    HPI  Patient presents for follow-up on hypertension and mood. Patient reports tolerating sertraline 25 mg. Has not noticed a difference with anxiety. No SI/HI.  HTN: Pt denies chest pain, palpitations, dizziness or lower extremity swelling. Taking medication as directed without side effects. Patient has been checking BP at home and readings range from 110-130/70-80. Trying to monitor sodium intake.   Medications: Outpatient Medications Prior to Visit  Medication Sig   [DISCONTINUED] lisinopril (ZESTRIL) 10 MG tablet Take 1 tablet (10 mg total) by mouth daily.   [DISCONTINUED] predniSONE (DELTASONE) 20 MG tablet Take 2 tablets by mouth x 2 days, 1 tablets x 2 days, 0.5 tablet x 2 days   [DISCONTINUED] sertraline (ZOLOFT) 25 MG tablet Take 1 tablet (25 mg total) by mouth daily.   No facility-administered medications prior to visit.      11/29/2021    1:25 PM 11/07/2021    9:53 AM  Depression screen PHQ 2/9  Decreased Interest 1 1  Down, Depressed, Hopeless 1 1  PHQ - 2 Score 2 2  Altered sleeping 0 2  Tired, decreased energy 1 1  Change in appetite 0 1  Feeling bad or failure about yourself  0 1  Trouble concentrating 0 2  Moving slowly or fidgety/restless 0 0  Suicidal thoughts 0 1  PHQ-9 Score 3 10  Difficult doing work/chores Not difficult at all Somewhat difficult      11/29/2021    1:26 PM 11/07/2021    9:53 AM  GAD 7 : Generalized Anxiety Score  Nervous, Anxious, on Edge 1 1  Control/stop worrying 1 1  Worry too much - different things 1 1  Trouble relaxing 0 1  Restless 0 1  Easily annoyed or irritable 0 1  Afraid - awful might happen 1 1  Total GAD 7 Score 4 7  Anxiety Difficulty Not difficult at all Not difficult at all       Review of Systems Review of  Systems:  A fourteen system review of systems was performed and found to be positive as per HPI.     Objective    BP 129/72   Pulse 77   Temp 97.7 F (36.5 C)   Ht 5\' 4"  (1.626 m)   Wt 206 lb (93.4 kg)   SpO2 94%   BMI 35.36 kg/m  BP Readings from Last 3 Encounters:  11/29/21 129/72  11/07/21 134/74  04/14/19 (!) 156/92   Wt Readings from Last 3 Encounters:  11/29/21 206 lb (93.4 kg)  11/07/21 208 lb (94.3 kg)  01/20/16 227 lb 9.6 oz (103.2 kg)    Physical Exam  General:  Pleasant and cooperative, in no acute distress. Neuro:  Alert and oriented,  extra-ocular muscles intact  HEENT:  Normocephalic, atraumatic, neck supple  Skin:  no gross rash, warm, pink. Cardiac:  RRR, S1 S2 Respiratory: CTA B/L  Vascular:  Ext warm, no cyanosis apprec.; cap RF less 2 sec. Psych:  No HI/SI, judgement and insight good, Euthymic mood. Full Affect.   No results found for any visits on 11/29/21.  Assessment & Plan      Problem List Items Addressed This Visit       Cardiovascular and Mediastinum   Primary hypertension - Primary    -  BP has improved. Will continue Lisinopril 10 mg daily. Will continue to monitor. Recommend collecting CMP at f/up visit. Patient reports recently had labs with UC, have not received requested records yet.      Relevant Medications   lisinopril (ZESTRIL) 10 MG tablet     Other   Anxiety    -Discussed with patient increasing sertraline to 50 mg daily to help improve mood. Patient is agreeable. Advised to let me know if unable to tolerate increased dose or ineffective. Will continue to monitor.      Relevant Medications   sertraline (ZOLOFT) 50 MG tablet    Return in about 3 months (around 03/01/2022) for HTN, Mood.        Mayer Masker, PA-C  [facility] at [facility] [phone] (phone) [phone] (fax)  [facility]

## 2021-12-06 ENCOUNTER — Ambulatory Visit: Payer: 59 | Admitting: Physician Assistant

## 2021-12-21 ENCOUNTER — Other Ambulatory Visit: Payer: Self-pay | Admitting: Physician Assistant

## 2021-12-21 DIAGNOSIS — F419 Anxiety disorder, unspecified: Secondary | ICD-10-CM

## 2022-01-17 ENCOUNTER — Other Ambulatory Visit: Payer: Self-pay | Admitting: Physician Assistant

## 2022-01-17 DIAGNOSIS — F419 Anxiety disorder, unspecified: Secondary | ICD-10-CM

## 2022-02-14 ENCOUNTER — Other Ambulatory Visit: Payer: Self-pay | Admitting: Physician Assistant

## 2022-02-14 DIAGNOSIS — F419 Anxiety disorder, unspecified: Secondary | ICD-10-CM

## 2022-03-01 ENCOUNTER — Encounter: Payer: Self-pay | Admitting: Physician Assistant

## 2022-03-01 ENCOUNTER — Ambulatory Visit (INDEPENDENT_AMBULATORY_CARE_PROVIDER_SITE_OTHER): Payer: 59 | Admitting: Physician Assistant

## 2022-03-01 VITALS — BP 124/83 | HR 85 | Temp 97.7°F | Ht 64.0 in | Wt 210.8 lb

## 2022-03-01 DIAGNOSIS — F419 Anxiety disorder, unspecified: Secondary | ICD-10-CM

## 2022-03-01 DIAGNOSIS — I10 Essential (primary) hypertension: Secondary | ICD-10-CM | POA: Diagnosis not present

## 2022-03-01 DIAGNOSIS — R69 Illness, unspecified: Secondary | ICD-10-CM | POA: Diagnosis not present

## 2022-03-01 MED ORDER — LISINOPRIL 10 MG PO TABS: 10.0000 mg | ORAL_TABLET | Freq: Every day | ORAL | 3 refills | Status: DC

## 2022-03-01 MED ORDER — SERTRALINE HCL 50 MG PO TABS: 50.0000 mg | ORAL_TABLET | Freq: Every day | ORAL | 3 refills | Status: DC

## 2022-03-01 NOTE — Assessment & Plan Note (Signed)
-  Improved and wnl's. Continue Lisinopril 10 mg daily. Will collect CMP for medication monitoring. Will continue to monitor.

## 2022-03-01 NOTE — Progress Notes (Signed)
Established patient visit   Patient: Vanessa Rhodes   DOB: 1960-07-06   61 y.o. Female  MRN: [id number] Visit Date: 03/01/2022  Chief Complaint  Patient presents with   Follow-up    Pt here for HTN and mood    Subjective    HPI HPI     Follow-up    Additional comments: Pt here for HTN and mood       Last edited by Lowella Bandy, [place] on 03/01/2022  8:41 AM.      Patient presents for follow-up on hypertension and mood.  HTN: Pt denies chest pain, palpitations, dizziness or lower extremity swelling. Taking medication as directed without side effects. Checks BP at home few times/wk and readings range in 120s/60s.   Mood: Patient reports medication compliance. States feeling much better. Reports her husband has also noticed an improvement. Not as anxious. Tolerating sertraline 50 mg without issues.      03/01/2022    8:47 AM 11/29/2021    1:25 PM 11/07/2021    9:53 AM  Depression screen PHQ 2/9  Decreased Interest 2 1 1   Down, Depressed, Hopeless 1 1 1   PHQ - 2 Score 3 2 2   Altered sleeping 1 0 2  Tired, decreased energy 1 1 1   Change in appetite 1 0 1  Feeling bad or failure about yourself  1 0 1  Trouble concentrating 1 0 2  Moving slowly or fidgety/restless 0 0 0  Suicidal thoughts 0 0 1  PHQ-9 Score 8 3 10   Difficult doing work/chores Not difficult at all Not difficult at all Somewhat difficult      03/01/2022    8:47 AM 11/29/2021    1:26 PM 11/07/2021    9:53 AM  GAD 7 : Generalized Anxiety Score  Nervous, Anxious, on Edge 1 1 1   Control/stop worrying 1 1 1   Worry too much - different things 1 1 1   Trouble relaxing 1 0 1  Restless 1 0 1  Easily annoyed or irritable 0 0 1  Afraid - awful might happen 1 1 1   Total GAD 7 Score 6 4 7   Anxiety Difficulty Not difficult at all Not difficult at all Not difficult at all       Medications: Outpatient Medications Prior to Visit  Medication Sig   [DISCONTINUED] lisinopril (ZESTRIL) 10 MG tablet Take 1 tablet (10  mg total) by mouth daily.   [DISCONTINUED] sertraline (ZOLOFT) 50 MG tablet Take 1 tablet by mouth once daily   No facility-administered medications prior to visit.    Review of Systems Review of Systems:  A fourteen system review of systems was performed and found to be positive as per HPI.     Objective    BP 124/83   Pulse 85   Temp 97.7 F (36.5 C) (Temporal)   Ht 5\' 4"  (1.626 m)   Wt 210 lb 12 oz (95.6 kg)   SpO2 100%   BMI 36.18 kg/m  BP Readings from Last 3 Encounters:  03/01/22 124/83  11/29/21 129/72  11/07/21 134/74   Wt Readings from Last 3 Encounters:  03/01/22 210 lb 12 oz (95.6 kg)  11/29/21 206 lb (93.4 kg)  11/07/21 208 lb (94.3 kg)    Physical Exam  General:  Pleasant and cooperative, in no acute distress, appropriate for stated age.  Neuro:  Alert and oriented,  extra-ocular muscles intact  HEENT:  Normocephalic, atraumatic, neck supple  Skin:  no gross rash, warm, pink. Cardiac:  RRR, S1 S2, no murmur Respiratory: CTA B/L  Vascular:  Ext warm, no cyanosis apprec.; cap RF less 2 sec. Psych:  No HI/SI, judgement and insight good, Euthymic mood. Full Affect.   No results found for any visits on 03/01/22.  Assessment & Plan      Problem List Items Addressed This Visit       Cardiovascular and Mediastinum   Primary hypertension - Primary    -Improved and wnl's. Continue Lisinopril 10 mg daily. Will collect CMP for medication monitoring. Will continue to monitor.      Relevant Medications   lisinopril (ZESTRIL) 10 MG tablet   Other Relevant Orders   Comp Met (CMET)     Other   Anxiety    -Improved. Recommend to continue sertraline 50 mg daily. Advised patient if notices unintentional weight gain then recommend changing medication. Pt verbalized understanding. Will continue to monitor.      Relevant Medications   sertraline (ZOLOFT) 50 MG tablet    Return in about 4 months (around 07/01/2022) for CPE and FBW few days prior.         Lorrene Reid, PA-C  [facility] at [facility] [phone] (phone) [phone] (fax)  [place]

## 2022-03-01 NOTE — Patient Instructions (Signed)

## 2022-03-01 NOTE — Assessment & Plan Note (Signed)
-  Improved. Recommend to continue sertraline 50 mg daily. Advised patient if notices unintentional weight gain then recommend changing medication. Pt verbalized understanding. Will continue to monitor.

## 2022-03-02 LAB — COMPREHENSIVE METABOLIC PANEL
ALT: 19 IU/L (ref 0–32)
AST: 16 IU/L (ref 0–40)
Albumin/Globulin Ratio: 2.2 (ref 1.2–2.2)
Albumin: 4.6 g/dL (ref 3.9–4.9)
Alkaline Phosphatase: 64 IU/L (ref 44–121)
BUN/Creatinine Ratio: 17 (ref 12–28)
BUN: 13 mg/dL (ref 8–27)
Bilirubin Total: 0.4 mg/dL (ref 0.0–1.2)
CO2: 25 mmol/L (ref 20–29)
Calcium: 10 mg/dL (ref 8.7–10.3)
Chloride: 102 mmol/L (ref 96–106)
Creatinine, Ser: 0.75 mg/dL (ref 0.57–1.00)
Globulin, Total: 2.1 g/dL (ref 1.5–4.5)
Glucose: 96 mg/dL (ref 70–99)
Potassium: 4.6 mmol/L (ref 3.5–5.2)
Sodium: 139 mmol/L (ref 134–144)
Total Protein: 6.7 g/dL (ref 6.0–8.5)
eGFR: 89 mL/min/{1.73_m2} (ref >59)

## 2022-06-14 ENCOUNTER — Other Ambulatory Visit: Payer: Self-pay

## 2022-06-14 DIAGNOSIS — Z130 Encounter for screening for diseases of the blood and blood-forming organs and certain disorders involving the immune mechanism: Secondary | ICD-10-CM

## 2022-06-14 DIAGNOSIS — Z0000 Encounter for general adult medical examination without abnormal findings: Secondary | ICD-10-CM

## 2022-06-14 DIAGNOSIS — I10 Essential (primary) hypertension: Secondary | ICD-10-CM

## 2022-06-25 ENCOUNTER — Other Ambulatory Visit: Payer: 59

## 2022-06-25 DIAGNOSIS — I10 Essential (primary) hypertension: Secondary | ICD-10-CM

## 2022-06-25 DIAGNOSIS — Z130 Encounter for screening for diseases of the blood and blood-forming organs and certain disorders involving the immune mechanism: Secondary | ICD-10-CM

## 2022-06-25 DIAGNOSIS — Z0000 Encounter for general adult medical examination without abnormal findings: Secondary | ICD-10-CM

## 2022-06-26 LAB — COMPREHENSIVE METABOLIC PANEL
ALT: 25 IU/L (ref 0–32)
AST: 20 IU/L (ref 0–40)
Albumin/Globulin Ratio: 2.5 — ABNORMAL HIGH (ref 1.2–2.2)
Albumin: 4.7 g/dL (ref 3.9–4.9)
Alkaline Phosphatase: 59 IU/L (ref 44–121)
BUN/Creatinine Ratio: 18 (ref 12–28)
BUN: 13 mg/dL (ref 8–27)
Bilirubin Total: 0.4 mg/dL (ref 0.0–1.2)
CO2: 22 mmol/L (ref 20–29)
Calcium: 9.8 mg/dL (ref 8.7–10.3)
Chloride: 103 mmol/L (ref 96–106)
Creatinine, Ser: 0.71 mg/dL (ref 0.57–1.00)
Globulin, Total: 1.9 g/dL (ref 1.5–4.5)
Glucose: 103 mg/dL — ABNORMAL HIGH (ref 70–99)
Potassium: 4.8 mmol/L (ref 3.5–5.2)
Sodium: 142 mmol/L (ref 134–144)
Total Protein: 6.6 g/dL (ref 6.0–8.5)
eGFR: 95 mL/min/{1.73_m2} (ref >59)

## 2022-06-26 LAB — TSH: TSH: 2 u[IU]/mL (ref 0.450–4.500)

## 2022-06-26 LAB — LIPID PANEL
Chol/HDL Ratio: 4.4 ratio (ref 0.0–4.4)
Cholesterol, Total: 226 mg/dL — ABNORMAL HIGH (ref 100–199)
HDL: 51 mg/dL (ref >39)
LDL Chol Calc (NIH): 133 mg/dL — ABNORMAL HIGH (ref 0–99)
Triglycerides: 236 mg/dL — ABNORMAL HIGH (ref 0–149)
VLDL Cholesterol Cal: 42 mg/dL — ABNORMAL HIGH (ref 5–40)

## 2022-06-26 LAB — CBC
Hematocrit: 45.4 % (ref 34.0–46.6)
Hemoglobin: 15.5 g/dL (ref 11.1–15.9)
MCH: 30.2 pg (ref 26.6–33.0)
MCHC: 34.1 g/dL (ref 31.5–35.7)
MCV: 88 fL (ref 79–97)
Platelets: 221 10*3/uL (ref 150–450)
RBC: 5.14 x10E6/uL (ref 3.77–5.28)
RDW: 12.8 % (ref 11.7–15.4)
WBC: 6.5 10*3/uL (ref 3.4–10.8)

## 2022-06-26 LAB — HEMOGLOBIN A1C
Est. average glucose Bld gHb Est-mCnc: 120 mg/dL
Hgb A1c MFr Bld: 5.8 % — ABNORMAL HIGH (ref 4.8–5.6)

## 2022-06-26 NOTE — Progress Notes (Signed)
Elevated lipids and HgbA1c. Other labs good. Discuss with patient at visit 07/02/2022

## 2022-07-01 NOTE — Progress Notes (Unsigned)
Complete physical exam   Patient: Vanessa Rhodes   DOB: May 21, 1961   62 y.o. Female  MRN: [id number] Visit Date: 07/02/2022    No chief complaint on file.  Subjective    Vanessa Rhodes is a 62 y.o. female who presents today for a complete physical exam.  She reports consuming a {diet types:17450} diet. {Exercise:19826} She generally feels {well/fairly well/poorly:18703}. She {does/does not:200015} have additional problems to discuss today.   HPI  Annual physical  -routine, fasting labs done just prior to  this visit  --mild, generalized elevation of lipids.  -glucose 103 with HgbA1c 5.8.  --other labs essentially normal.  -history of HTN --generally well controlled.  -GAD --takes sertraline 50 mg  daily  -?GYN provider -?mammogram -?screening for colon cancer   No past medical history on file. Past Surgical History:  Procedure Laterality Date   KNEE SURGERY Left 2007   TUBAL LIGATION  1979   Social History   Socioeconomic History   Marital status: Married    Spouse name: Vanessa Rhodes   Number of children: 2   Years of education: GED   Highest education level: Not on file  Occupational History   Occupation: RETIRED  Tobacco Use   Smoking status: Former    Types: Cigarettes    Quit date: 06/04/2004    Years since quitting: 18.0   Smokeless tobacco: Former  Scientific laboratory technician Use: Never used  Substance and Sexual Activity   Alcohol use: Yes    Comment: occasional    Drug use: No   Sexual activity: Yes    Partners: Male    Birth control/protection: None  Other Topics Concern   Not on file  Social History Narrative   Not on file   Social Determinants of Health   Financial Resource Strain: Not on file  Food Insecurity: Not on file  Transportation Needs: Not on file  Physical Activity: Not on file  Stress: Not on file  Social Connections: Not on file  Intimate Partner Violence: Not on file   Family Status  Relation Name Status   Mother  Alive    Father  Deceased   Family History  Problem Relation Age of Onset   Pulmonary disease Father    No Known Allergies  Patient Care Team: Lorrene Reid, PA-C as PCP - General (Physician Assistant)   Medications: Outpatient Medications Prior to Visit  Medication Sig   lisinopril (ZESTRIL) 10 MG tablet Take 1 tablet (10 mg total) by mouth daily.   sertraline (ZOLOFT) 50 MG tablet Take 1 tablet (50 mg total) by mouth daily.   No facility-administered medications prior to visit.    Review of Systems  {Labs (Optional):23779}   Objective    There were no vitals filed for this visit. There is no height or weight on file to calculate BMI.  BP Readings from Last 3 Encounters:  03/01/22 124/83  11/29/21 129/72  11/07/21 134/74    Wt Readings from Last 3 Encounters:  03/01/22 210 lb 12 oz (95.6 kg)  11/29/21 206 lb (93.4 kg)  11/07/21 208 lb (94.3 kg)     Physical Exam  ***  Last depression screening scores   Row Labels 03/01/2022    8:47 AM 11/29/2021    1:25 PM 11/07/2021    9:53 AM  PHQ 2/9 Scores   Section Header. No data exists in this row.     PHQ - 2 Score   3 2 2   PHQ-  9 Score   8 3 10   Exception Documentation   Medical reason     Last fall risk screening   Row Labels 03/01/2022    8:47 AM  [place]. No data exists in this row.   Falls in the past year?   0  Number falls in past yr:   0  Injury with Fall?   0  Risk for fall due to :   No Fall Risks  Follow up   Falls evaluation completed   Last Audit-C alcohol use screening   No data to display    A score of 3 or more in women, and 4 or more in men indicates increased risk for alcohol abuse, EXCEPT if all of the points are from question 1   No results found for any visits on 07/02/22.  Assessment & Plan    Routine Health Maintenance and Physical Exam  Exercise Activities and Dietary recommendations  Goals   None     Immunization History  Administered Date(s) Administered    Influenza,inj,Quad PF,6+ Mos 02/27/2016   Tdap 12/30/2015    Health Maintenance  Topic Date Due   COVID-19 Vaccine (1) Never done   HIV Screening  Never done   Hepatitis C Screening  Never done   PAP SMEAR-Modifier  Never done   COLONOSCOPY (Pts 45-11yrs Insurance coverage will need to be confirmed)  Never done   MAMMOGRAM  Never done   Zoster Vaccines- Shingrix (1 of 2) Never done   INFLUENZA VACCINE  01/02/2022   DTaP/Tdap/Td (2 - Td or Tdap) 12/29/2025   HPV VACCINES  Aged Out    Discussed health benefits of physical activity, and encouraged her to engage in regular exercise appropriate for her age and condition.  Problem List Items Addressed This Visit   None    No follow-ups on file.        Ronnell Freshwater, NP  [facility] at [facility] [phone] (phone) [phone] (fax)  [place]

## 2022-07-02 ENCOUNTER — Ambulatory Visit (INDEPENDENT_AMBULATORY_CARE_PROVIDER_SITE_OTHER): Payer: 59 | Admitting: Nurse Practitioner

## 2022-07-02 ENCOUNTER — Encounter: Payer: Self-pay | Admitting: Nurse Practitioner

## 2022-07-02 VITALS — BP 120/80 | HR 61 | Ht 64.0 in | Wt 213.8 lb

## 2022-07-02 DIAGNOSIS — F419 Anxiety disorder, unspecified: Secondary | ICD-10-CM | POA: Diagnosis not present

## 2022-07-02 DIAGNOSIS — Z0001 Encounter for general adult medical examination with abnormal findings: Secondary | ICD-10-CM | POA: Diagnosis not present

## 2022-07-02 DIAGNOSIS — Z1211 Encounter for screening for malignant neoplasm of colon: Secondary | ICD-10-CM

## 2022-07-02 DIAGNOSIS — Z23 Encounter for immunization: Secondary | ICD-10-CM | POA: Diagnosis not present

## 2022-07-02 DIAGNOSIS — I10 Essential (primary) hypertension: Secondary | ICD-10-CM | POA: Diagnosis not present

## 2022-07-02 DIAGNOSIS — E785 Hyperlipidemia, unspecified: Secondary | ICD-10-CM | POA: Diagnosis not present

## 2022-07-02 DIAGNOSIS — Z6836 Body mass index (BMI) 36.0-36.9, adult: Secondary | ICD-10-CM

## 2022-07-02 DIAGNOSIS — Z1231 Encounter for screening mammogram for malignant neoplasm of breast: Secondary | ICD-10-CM

## 2022-07-26 ENCOUNTER — Telehealth: Payer: Self-pay

## 2022-07-26 DIAGNOSIS — F419 Anxiety disorder, unspecified: Secondary | ICD-10-CM

## 2022-07-26 MED ORDER — SERTRALINE HCL 50 MG PO TABS: 50.0000 mg | ORAL_TABLET | Freq: Every day | ORAL | 1 refills | Status: DC

## 2022-07-26 NOTE — Telephone Encounter (Signed)
Refill sent.

## 2022-07-26 NOTE — Telephone Encounter (Signed)
Pt is requesting a refill on: sertraline (ZOLOFT) 50 MG tablet   Pharmacy: [facility] [address] [place] 07/02/22 ROV 12/31/22

## 2022-08-13 ENCOUNTER — Telehealth: Payer: Self-pay | Admitting: *Deleted

## 2022-08-13 NOTE — Telephone Encounter (Signed)
Opened in error

## 2022-09-07 ENCOUNTER — Other Ambulatory Visit: Payer: Self-pay

## 2022-09-07 DIAGNOSIS — I10 Essential (primary) hypertension: Secondary | ICD-10-CM

## 2022-09-07 MED ORDER — LISINOPRIL 10 MG PO TABS: 10.0000 mg | ORAL_TABLET | Freq: Every day | ORAL | 0 refills | Status: DC

## 2022-12-04 ENCOUNTER — Other Ambulatory Visit: Payer: Self-pay | Admitting: Family Medicine

## 2022-12-04 DIAGNOSIS — I10 Essential (primary) hypertension: Secondary | ICD-10-CM

## 2022-12-28 ENCOUNTER — Ambulatory Visit: Payer: 59 | Admitting: Family Medicine

## 2022-12-28 ENCOUNTER — Encounter: Payer: Self-pay | Admitting: Family Medicine

## 2022-12-28 VITALS — BP 130/79 | HR 61 | Ht 64.0 in | Wt 213.0 lb

## 2022-12-28 DIAGNOSIS — Z1212 Encounter for screening for malignant neoplasm of rectum: Secondary | ICD-10-CM

## 2022-12-28 DIAGNOSIS — Z1159 Encounter for screening for other viral diseases: Secondary | ICD-10-CM | POA: Diagnosis not present

## 2022-12-28 DIAGNOSIS — I10 Essential (primary) hypertension: Secondary | ICD-10-CM

## 2022-12-28 DIAGNOSIS — F419 Anxiety disorder, unspecified: Secondary | ICD-10-CM | POA: Diagnosis not present

## 2022-12-28 DIAGNOSIS — Z1211 Encounter for screening for malignant neoplasm of colon: Secondary | ICD-10-CM

## 2022-12-28 DIAGNOSIS — E785 Hyperlipidemia, unspecified: Secondary | ICD-10-CM | POA: Diagnosis not present

## 2022-12-28 NOTE — Assessment & Plan Note (Signed)
BP goal <140/90.  Stable, at goal.  Continue lisinopril 10 mg daily.  Collecting CMP for medication monitoring today.  Will continue to monitor.

## 2022-12-28 NOTE — Assessment & Plan Note (Signed)
Last lipid panel: LDL 133, HDL 51, triglycerides 236.  Repeating lipid panel today.  Based on results, may consider starting something like fenofibrate if triglycerides remain elevated.

## 2022-12-28 NOTE — Patient Instructions (Signed)
We can update your pap smear (for the very last time! Renee Rival!) at your next annual physical if you would like. Just let Morrie Sheldon know so she can schedule for the appropriate amount of time :)

## 2022-12-28 NOTE — Assessment & Plan Note (Signed)
Improved.  Continue to monitor closely.  In the future, if medication is needed recommend trial of something other than sertraline.

## 2022-12-28 NOTE — Progress Notes (Signed)
Established Patient Office Visit  Subjective   Patient ID: Vanessa Rhodes, female    DOB: 1961-03-18  Age: 62 y.o. MRN: [id number]  Chief Complaint  Patient presents with   Hypertension    HPI Vanessa Rhodes is a 62 y.o. female presenting today for follow up of hypertension, hyperlipidemia,mood. Hypertension:  Pt denies chest pain, SOB, dizziness, edema, syncope, fatigue or heart palpitations. Taking lisinopril, reports excellent compliance with treatment. Denies side effects. Hyperlipidemia: Currently consuming a low fat diet getting physical activity. The 10-year ASCVD risk score (Arnett DK, et al., 2019) is: 7.7% Mood: Discontinued Zoloft because she felt that it was no longer helpful.  Ever since stopping, she feels that mood has been very stable and does not want any medication at this time.  She is aware that if this changes in the future, we have other medication options to try.    12/28/2022    8:56 AM 07/02/2022    8:16 AM 03/01/2022    8:47 AM  Depression screen PHQ 2/9  Decreased Interest 0 0 2  Down, Depressed, Hopeless 0 0 1  PHQ - 2 Score 0 0 3  Altered sleeping 0 2 1  Tired, decreased energy 1 1 1   Change in appetite 2 1 1   Feeling bad or failure about yourself  0 1 1  Trouble concentrating 0 1 1  Moving slowly or fidgety/restless 0 0 0  Suicidal thoughts 0 0 0  PHQ-9 Score 3 6 8   Difficult doing work/chores Not difficult at all  Not difficult at all      12/28/2022    8:56 AM 07/02/2022    8:16 AM 03/01/2022    8:47 AM 11/29/2021    1:26 PM  GAD 7 : Generalized Anxiety Score  Nervous, Anxious, on Edge 0 1 1 1   Control/stop worrying 0 1 1 1   Worry too much - different things 0 1 1 1   Trouble relaxing 0 1 1 0  Restless 0 1 1 0  Easily annoyed or irritable 0 0 0 0  Afraid - awful might happen 0 1 1 1   Total GAD 7 Score 0 6 6 4   Anxiety Difficulty Not difficult at all  Not difficult at all Not difficult at all   ROS Negative unless otherwise noted in  HPI   Objective:     BP 130/79   Pulse 61   Ht 5\' 4"  (1.626 m)   Wt 213 lb (96.6 kg)   SpO2 95%   BMI 36.56 kg/m   Physical Exam Constitutional:      General: She is not in acute distress.    Appearance: Normal appearance.  HENT:     Head: Normocephalic and atraumatic.  Cardiovascular:     Rate and Rhythm: Normal rate and regular rhythm.     Heart sounds: No murmur heard.    No friction rub. No gallop.  Pulmonary:     Effort: Pulmonary effort is normal. No respiratory distress.     Breath sounds: No wheezing, rhonchi or rales.  Skin:    General: Skin is warm and dry.  Neurological:     Mental Status: She is alert and oriented to person, place, and time.      Assessment & Plan:  Essential hypertension Assessment & Plan: BP goal <140/90.  Stable, at goal.  Continue lisinopril 10 mg daily.  Collecting CMP for medication monitoring today.  Will continue to monitor.  Orders: -  CBC with Differential/Platelet; Future -     Comprehensive metabolic panel; Future  Hyperlipidemia LDL goal <100 Assessment & Plan: Last lipid panel: LDL 133, HDL 51, triglycerides 236.  Repeating lipid panel today.  Based on results, may consider starting something like fenofibrate if triglycerides remain elevated.  Orders: -     Lipid panel; Future  Anxiety Assessment & Plan: Improved.  Continue to monitor closely.  In the future, if medication is needed recommend trial of something other than sertraline.   Screening for viral disease -     Hepatitis C antibody; Future -     HIV Antibody (routine testing w rflx); Future  Screening for colorectal cancer -     Cologuard  Patient is agreeable to having HIV and hepatitis C screening drawn with labs today.  She is also open to having Cologuard ordered.  We also discussed recommendations for Pap smear and shingles shots.  She is going on a weekend trip and today is not a good time to get the first shot, but she is aware that she can  schedule nurse visit here or go to her local pharmacy.  She will think about having another Pap smear, we discussed that she would likely only need one more, her last Pap smear was in 2007.  Return in about 1 week (around 01/04/2023) for fasting blood work; 6 months for annual physical, fasting labs 1 week before.    Melida Quitter, PA

## 2022-12-31 ENCOUNTER — Ambulatory Visit: Payer: 59 | Admitting: Family Medicine

## 2023-01-03 ENCOUNTER — Ambulatory Visit: Payer: 59 | Admitting: Family Medicine

## 2023-01-04 ENCOUNTER — Other Ambulatory Visit: Payer: 59

## 2023-01-04 DIAGNOSIS — Z1159 Encounter for screening for other viral diseases: Secondary | ICD-10-CM

## 2023-01-04 DIAGNOSIS — I10 Essential (primary) hypertension: Secondary | ICD-10-CM

## 2023-01-04 DIAGNOSIS — E785 Hyperlipidemia, unspecified: Secondary | ICD-10-CM

## 2023-02-13 ENCOUNTER — Other Ambulatory Visit: Payer: Self-pay | Admitting: Family Medicine

## 2023-02-13 ENCOUNTER — Ambulatory Visit: Payer: 59 | Admitting: Family Medicine

## 2023-02-13 ENCOUNTER — Encounter: Payer: Self-pay | Admitting: Family Medicine

## 2023-02-13 VITALS — BP 134/82 | HR 84 | Ht 64.0 in | Wt 211.4 lb

## 2023-02-13 DIAGNOSIS — R1011 Right upper quadrant pain: Secondary | ICD-10-CM | POA: Diagnosis not present

## 2023-02-13 MED ORDER — ONDANSETRON 8 MG PO TBDP: 8.0000 mg | ORAL_TABLET | Freq: Three times a day (TID) | ORAL | 0 refills | Status: DC | PRN

## 2023-02-13 NOTE — Progress Notes (Unsigned)
   Acute Office Visit  Subjective:     Patient ID: Vanessa Rhodes, female    DOB: Oct 02, 1960, 62 y.o.   MRN: [id number]  Chief Complaint  Patient presents with   Nausea   Back Pain    HPI Patient is in today for abdominal and back pain.  Patient states her symptoms started Saturday morning.  Was not doing anything particular to injure herself.  The pain is right sided upper abdominal pain and radiation to the back.  Not worsened by breathing or movement.  Does not appear to be worsened by eating.  Has had nausea but no vomiting.  No changes in bowel movements.  No urinary changes.  Has had decreased appetite only has had applesauce this morning.  Had a tubal ligation but no other abdominal surgeries.  Denies fever, endorses some feeling of chills.  Pain typically last all day.  Laying on her left side helped somewhat.  Has not been around anybody else that has been sick lately, did not eat any new foods recently.  Takes lisinopril and a women's multivitamin but no other medications.   ROS      Objective:    BP 134/82   Pulse 84   Ht 5\' 4"  (1.626 m)   Wt 211 lb 6.4 oz (95.9 kg)   SpO2 96%   BMI 36.29 kg/m  {Vitals History (Optional):23777}  Physical Exam General: Alert, oriented.  Appears in mild discomfort. HEENT: PERRLA, EOMI, moist mucosa CV: Regular rate and rhythm Pulmonary: Lungs are bilaterally GI: Soft, tender to palpation in the right upper quadrant MSK: Right-sided CVA tenderness   No results found for any visits on 02/13/23.      Assessment & Plan:   Right upper quadrant abdominal pain Assessment & Plan: Patient has 5 days now of right upper quadrant abdominal pain and right flank pain.  No dysuria, no change in bowel movements.  Nausea but no vomiting.  No fevers.  Tender to palpation of the right upper quadrant and right flank.  I have ordered stat CT abdomen that we will be done tomorrow at [facility] imaging to look for kidney stone.  Could also  evaluate for possible gallbladder disease.  Have also ordered stat CBC, CMP, lipase. - Zofran ordered. - Ibuprofen recommended for pain as needed.  Recommended to avoid Tylenol until we get LFT results back.  Patient does not tolerate opioids very well. - Discussed with patient reasons to go to the emergency department including onset of fever, uncontrolled vomiting, severe pain.  Orders: -     CT ABDOMEN PELVIS WO CONTRAST; Future -     CBC With Differential; Future -     Comprehensive metabolic panel; Future -     Lipase; Future  Other orders -     Ondansetron; Take 1 tablet (8 mg total) by mouth every 8 (eight) hours as needed for nausea or vomiting.  Dispense: 20 tablet; Refill: 0     Return in about 1 week (around 02/20/2023) for abd pain.  Sandre Kitty, MD

## 2023-02-13 NOTE — Assessment & Plan Note (Signed)
Patient has 5 days now of right upper quadrant abdominal pain and right flank pain.  No dysuria, no change in bowel movements.  Nausea but no vomiting.  No fevers.  Tender to palpation of the right upper quadrant and right flank.  I have ordered stat CT abdomen that we will be done tomorrow at [facility] imaging to look for kidney stone.  Could also evaluate for possible gallbladder disease.  Have also ordered stat CBC, CMP, lipase. - Zofran ordered. - Ibuprofen recommended for pain as needed.  Recommended to avoid Tylenol until we get LFT results back.  Patient does not tolerate opioids very well. - Discussed with patient reasons to go to the emergency department including onset of fever, uncontrolled vomiting, severe pain.

## 2023-02-13 NOTE — Patient Instructions (Addendum)
It was nice to see you today,  We addressed the following topics today: -Your abdominal pain could be 1 of several things including kidney stone or your gallbladder. - I am ordering some blood test that I would like you have done at the [facility] on [address] near the hospital - I have ordered some abdominal imaging.  They can see here at [facility] imaging tomorrow - I have ordered you some nausea medicine.  You can also use ibuprofen for pain relief.  I would avoid Tylenol until we see your liver function results.  Have a great day,  Frederic Jericho, MD

## 2023-02-14 ENCOUNTER — Telehealth: Payer: Self-pay | Admitting: Family Medicine

## 2023-02-14 ENCOUNTER — Ambulatory Visit
Admission: RE | Admit: 2023-02-14 | Discharge: 2023-02-14 | Disposition: A | Discharge status: Home / Self Care | Payer: 59 | Source: Ambulatory Visit | Attending: Family Medicine | Admitting: Family Medicine

## 2023-02-14 DIAGNOSIS — R1011 Right upper quadrant pain: Secondary | ICD-10-CM

## 2023-02-14 LAB — CBC WITH DIFFERENTIAL/PLATELET
Basophils Absolute: 0 10*3/uL (ref 0.0–0.2)
Basos: 0 %
EOS (ABSOLUTE): 0 10*3/uL (ref 0.0–0.4)
Eos: 0 %
Hematocrit: 44.6 % (ref 34.0–46.6)
Hemoglobin: 14.8 g/dL (ref 11.1–15.9)
Immature Grans (Abs): 0 10*3/uL (ref 0.0–0.1)
Immature Granulocytes: 0 %
Lymphocytes Absolute: 2 10*3/uL (ref 0.7–3.1)
Lymphs: 20 %
MCH: 30.3 pg (ref 26.6–33.0)
MCHC: 33.2 g/dL (ref 31.5–35.7)
MCV: 91 fL (ref 79–97)
Monocytes Absolute: 0.7 10*3/uL (ref 0.1–0.9)
Monocytes: 7 %
Neutrophils Absolute: 7.1 10*3/uL — ABNORMAL HIGH (ref 1.4–7.0)
Neutrophils: 73 %
Platelets: 229 10*3/uL (ref 150–450)
RBC: 4.88 x10E6/uL (ref 3.77–5.28)
RDW: 12.6 % (ref 11.7–15.4)
WBC: 9.8 10*3/uL (ref 3.4–10.8)

## 2023-02-14 LAB — COMPREHENSIVE METABOLIC PANEL
ALT: 73 IU/L — ABNORMAL HIGH (ref 0–32)
AST: 19 IU/L (ref 0–40)
Albumin: 4.6 g/dL (ref 3.9–4.9)
Alkaline Phosphatase: 87 IU/L (ref 44–121)
BUN/Creatinine Ratio: 11 — ABNORMAL LOW (ref 12–28)
BUN: 8 mg/dL (ref 8–27)
Bilirubin Total: 0.7 mg/dL (ref 0.0–1.2)
CO2: 26 mmol/L (ref 20–29)
Calcium: 10 mg/dL (ref 8.7–10.3)
Chloride: 99 mmol/L (ref 96–106)
Creatinine, Ser: 0.73 mg/dL (ref 0.57–1.00)
Globulin, Total: 2.6 g/dL (ref 1.5–4.5)
Glucose: 104 mg/dL — ABNORMAL HIGH (ref 70–99)
Potassium: 4.1 mmol/L (ref 3.5–5.2)
Sodium: 140 mmol/L (ref 134–144)
Total Protein: 7.2 g/dL (ref 6.0–8.5)
eGFR: 92 mL/min/{1.73_m2} (ref >59)

## 2023-02-14 LAB — LIPASE: Lipase: 46 U/L (ref 14–72)

## 2023-02-14 NOTE — Telephone Encounter (Signed)
I was notified by the CMA that our office received a phone call regarding the patient's CT scan results.  She states they showed acute pancreatitis.  The stat labs I ordered which the patient had done yesterday have not resulted yet.  I called the patient to discuss the findings with her.  She says she is feeling better and even was able to eat some broth this morning.  I discussed with her the CT scan findings, the causes of pancreatitis, and the management of it.  Given that the patient is feeling better advised her we would hold off on going to the emergency department unless she started develop fever, vomiting or worse pain.  Advised her that she should advance slowly from a liquid diet to a soft bland diet and then back to her regular diet.  I will follow-up with her regarding the blood test results when we get them.  Advised her to schedule an appointment with a with me next week if able to have which time we will get some additional lab testing to help identify the cause of her pancreatitis.

## 2023-02-18 ENCOUNTER — Other Ambulatory Visit: Payer: Self-pay | Admitting: Family Medicine

## 2023-02-18 ENCOUNTER — Encounter: Payer: Self-pay | Admitting: Family Medicine

## 2023-02-18 ENCOUNTER — Ambulatory Visit: Payer: 59 | Admitting: Family Medicine

## 2023-02-18 VITALS — BP 119/77 | HR 66 | Ht 64.0 in | Wt 207.0 lb

## 2023-02-18 DIAGNOSIS — R1011 Right upper quadrant pain: Secondary | ICD-10-CM | POA: Diagnosis not present

## 2023-02-18 DIAGNOSIS — R7401 Elevation of levels of liver transaminase levels: Secondary | ICD-10-CM | POA: Insufficient documentation

## 2023-02-18 NOTE — Progress Notes (Unsigned)
Established Patient Office Visit  Subjective   Patient ID: Vanessa Rhodes, female    DOB: [birth year]  Age: 62 y.o. MRN: [id number]  Chief Complaint  Patient presents with   Medical Management of Chronic Issues    HPI  F/u pancreatitis -patient is back to eating a regular diet although she stated is a "better diet".  Eating more vegetables and fresh fruit.  Has already lost a few pounds.  No nausea vomiting.  Bowel movements normal.  Abdominal pain is much improved/minimal.  We discussed the patient's elevated ALT level.  Discussed rechecking it and further workup if necessary.  Encouraged continued improvements with diet and weight loss.  The 10-year ASCVD risk score (Arnett DK, et al., 2019) is: 6%  Health Maintenance Due  Topic Date Due   PAP SMEAR-Modifier  Never done   MAMMOGRAM  Never done   Zoster Vaccines- Shingrix (1 of 2) Never done   INFLUENZA VACCINE  01/03/2023   COVID-19 Vaccine (1 - 2023-24 season) Never done      Objective:     BP 119/77   Pulse 66   Ht 5\' 4"  (1.626 m)   Wt 207 lb (93.9 kg)   SpO2 97%   BMI 35.53 kg/m  {Vitals History (Optional):23777}  Physical Exam General: Alert, oriented GI: Soft, normal bowel sounds.  Minimally tender in the right upper quadrant. Psych: Pleasant affect   No results found for any visits on 02/18/23.      Assessment & Plan:   Elevated ALT measurement Assessment & Plan: Has been elevated in the past, then most recent values were normal until her visit last week when she is seen for acute pancreatitis.  Will recheck today.  If it continues to be elevated consider liver elastography and further workup including viral hepatitis and autoimmune workup.  Orders: -     Comprehensive metabolic panel  Right upper quadrant abdominal pain Assessment & Plan: CT scan showed evidence of pancreatitis.  Lipase was negative.  At visit today patient symptoms have resolved.  Recent triglycerides were mildly elevated but  not enough to typically cause pancreatitis.  No evidence of gallstone or gallbladder inflammation seen on the CT scan.  Patient has occasional mild to moderate alcohol use.  Uncertain what the specific cause of her pancreatitis was or why the lipase was normal.  Rechecking CMP today.  Encouraged continued improvements in diet.      No follow-ups on file.    Sandre Kitty, MD

## 2023-02-18 NOTE — Patient Instructions (Signed)
It was nice to see you today,  We addressed the following topics today: I would like to repeat your CMP to recheck your liver function test.  If they continue to be elevated we will discuss further workup.  Otherwise can just follow-up with Lequita Halt at your appointment in January. - If you develop acute abdominal pain again, please see [place] or another medical provider. - Continue eating healthy and losing weight.  This will improve your cholesterol and overall health.  Have a great day,  Frederic Jericho, MD

## 2023-02-18 NOTE — Assessment & Plan Note (Signed)
Has been elevated in the past, then most recent values were normal until her visit last week when she is seen for acute pancreatitis.  Will recheck today.  If it continues to be elevated consider liver elastography and further workup including viral hepatitis and autoimmune workup.

## 2023-02-18 NOTE — Assessment & Plan Note (Signed)
CT scan showed evidence of pancreatitis.  Lipase was negative.  At visit today patient symptoms have resolved.  Recent triglycerides were mildly elevated but not enough to typically cause pancreatitis.  No evidence of gallstone or gallbladder inflammation seen on the CT scan.  Patient has occasional mild to moderate alcohol use.  Uncertain what the specific cause of her pancreatitis was or why the lipase was normal.  Rechecking CMP today.  Encouraged continued improvements in diet.

## 2023-02-19 LAB — COMPREHENSIVE METABOLIC PANEL
ALT: 33 IU/L — ABNORMAL HIGH (ref 0–32)
AST: 20 IU/L (ref 0–40)
Albumin: 4.6 g/dL (ref 3.9–4.9)
Alkaline Phosphatase: 76 IU/L (ref 44–121)
BUN/Creatinine Ratio: 14 (ref 12–28)
BUN: 11 mg/dL (ref 8–27)
Bilirubin Total: 0.4 mg/dL (ref 0.0–1.2)
CO2: 24 mmol/L (ref 20–29)
Calcium: 10.2 mg/dL (ref 8.7–10.3)
Chloride: 100 mmol/L (ref 96–106)
Creatinine, Ser: 0.81 mg/dL (ref 0.57–1.00)
Globulin, Total: 2.3 g/dL (ref 1.5–4.5)
Glucose: 91 mg/dL (ref 70–99)
Potassium: 4.6 mmol/L (ref 3.5–5.2)
Sodium: 138 mmol/L (ref 134–144)
Total Protein: 6.9 g/dL (ref 6.0–8.5)
eGFR: 81 mL/min/{1.73_m2} (ref >59)

## 2023-02-20 ENCOUNTER — Ambulatory Visit: Payer: 59 | Admitting: Family Medicine

## 2023-02-23 ENCOUNTER — Other Ambulatory Visit: Payer: Self-pay | Admitting: Family Medicine

## 2023-02-23 DIAGNOSIS — I10 Essential (primary) hypertension: Secondary | ICD-10-CM

## 2023-03-02 ENCOUNTER — Other Ambulatory Visit: Payer: Self-pay

## 2023-03-02 ENCOUNTER — Inpatient Hospital Stay
Admission: EM | Admit: 2023-03-02 | Discharge: 2023-03-11 | DRG: 438 | Disposition: A | Discharge status: Home / Self Care | Payer: 59 | Attending: Internal Medicine | Admitting: Internal Medicine

## 2023-03-02 ENCOUNTER — Emergency Department: Payer: 59

## 2023-03-02 DIAGNOSIS — F419 Anxiety disorder, unspecified: Secondary | ICD-10-CM | POA: Diagnosis present

## 2023-03-02 DIAGNOSIS — I10 Essential (primary) hypertension: Secondary | ICD-10-CM | POA: Diagnosis present

## 2023-03-02 DIAGNOSIS — G9341 Metabolic encephalopathy: Secondary | ICD-10-CM | POA: Diagnosis not present

## 2023-03-02 DIAGNOSIS — G928 Other toxic encephalopathy: Secondary | ICD-10-CM | POA: Diagnosis not present

## 2023-03-02 DIAGNOSIS — Z836 Family history of other diseases of the respiratory system: Secondary | ICD-10-CM

## 2023-03-02 DIAGNOSIS — T40605A Adverse effect of unspecified narcotics, initial encounter: Secondary | ICD-10-CM | POA: Diagnosis not present

## 2023-03-02 DIAGNOSIS — T502X5A Adverse effect of carbonic-anhydrase inhibitors, benzothiadiazides and other diuretics, initial encounter: Secondary | ICD-10-CM | POA: Diagnosis not present

## 2023-03-02 DIAGNOSIS — Z6841 Body Mass Index (BMI) 40.0 and over, adult: Secondary | ICD-10-CM

## 2023-03-02 DIAGNOSIS — E781 Pure hyperglyceridemia: Secondary | ICD-10-CM | POA: Diagnosis present

## 2023-03-02 DIAGNOSIS — J189 Pneumonia, unspecified organism: Secondary | ICD-10-CM | POA: Diagnosis not present

## 2023-03-02 DIAGNOSIS — E871 Hypo-osmolality and hyponatremia: Secondary | ICD-10-CM | POA: Diagnosis not present

## 2023-03-02 DIAGNOSIS — K859 Acute pancreatitis without necrosis or infection, unspecified: Secondary | ICD-10-CM | POA: Diagnosis present

## 2023-03-02 DIAGNOSIS — N852 Hypertrophy of uterus: Secondary | ICD-10-CM

## 2023-03-02 DIAGNOSIS — K2980 Duodenitis without bleeding: Secondary | ICD-10-CM | POA: Diagnosis present

## 2023-03-02 DIAGNOSIS — Z87891 Personal history of nicotine dependence: Secondary | ICD-10-CM | POA: Diagnosis not present

## 2023-03-02 DIAGNOSIS — E876 Hypokalemia: Secondary | ICD-10-CM | POA: Diagnosis present

## 2023-03-02 DIAGNOSIS — E860 Dehydration: Secondary | ICD-10-CM | POA: Diagnosis not present

## 2023-03-02 DIAGNOSIS — E8770 Fluid overload, unspecified: Secondary | ICD-10-CM | POA: Diagnosis not present

## 2023-03-02 DIAGNOSIS — K8500 Idiopathic acute pancreatitis without necrosis or infection: Principal | ICD-10-CM | POA: Diagnosis present

## 2023-03-02 DIAGNOSIS — R9389 Abnormal findings on diagnostic imaging of other specified body structures: Secondary | ICD-10-CM | POA: Insufficient documentation

## 2023-03-02 DIAGNOSIS — R1084 Generalized abdominal pain: Secondary | ICD-10-CM | POA: Diagnosis present

## 2023-03-02 DIAGNOSIS — E785 Hyperlipidemia, unspecified: Secondary | ICD-10-CM | POA: Diagnosis present

## 2023-03-02 DIAGNOSIS — Z79899 Other long term (current) drug therapy: Secondary | ICD-10-CM | POA: Diagnosis not present

## 2023-03-02 DIAGNOSIS — R000 Tachycardia, unspecified: Secondary | ICD-10-CM | POA: Diagnosis not present

## 2023-03-02 DIAGNOSIS — J9601 Acute respiratory failure with hypoxia: Secondary | ICD-10-CM | POA: Diagnosis not present

## 2023-03-02 DIAGNOSIS — R0602 Shortness of breath: Secondary | ICD-10-CM | POA: Diagnosis not present

## 2023-03-02 HISTORY — DX: Hypokalemia: E87.6

## 2023-03-02 LAB — CBC
HCT: 46.1 % — ABNORMAL HIGH (ref 36.0–46.0)
Hemoglobin: 15.8 g/dL — ABNORMAL HIGH (ref 12.0–15.0)
MCH: 30 pg (ref 26.0–34.0)
MCHC: 34.3 g/dL (ref 30.0–36.0)
MCV: 87.5 fL (ref 80.0–100.0)
Platelets: 288 10*3/uL (ref 150–400)
RBC: 5.27 MIL/uL — ABNORMAL HIGH (ref 3.87–5.11)
RDW: 12.2 % (ref 11.5–15.5)
WBC: 10.1 10*3/uL (ref 4.0–10.5)
nRBC: 0 % (ref 0.0–0.2)

## 2023-03-02 LAB — COMPREHENSIVE METABOLIC PANEL
ALT: 39 U/L (ref 0–44)
AST: 47 U/L — ABNORMAL HIGH (ref 15–41)
Albumin: 4.3 g/dL (ref 3.5–5.0)
Alkaline Phosphatase: 56 U/L (ref 38–126)
Anion gap: 14 (ref 5–15)
BUN: 13 mg/dL (ref 8–23)
CO2: 23 mmol/L (ref 22–32)
Calcium: 9.5 mg/dL (ref 8.9–10.3)
Chloride: 103 mmol/L (ref 98–111)
Creatinine, Ser: 0.9 mg/dL (ref 0.44–1.00)
GFR, Estimated: >60 mL/min (ref >60)
Glucose, Bld: 150 mg/dL — ABNORMAL HIGH (ref 70–99)
Potassium: 3.4 mmol/L — ABNORMAL LOW (ref 3.5–5.1)
Sodium: 140 mmol/L (ref 135–145)
Total Bilirubin: 1.2 mg/dL (ref 0.3–1.2)
Total Protein: 7.2 g/dL (ref 6.5–8.1)

## 2023-03-02 LAB — LIPASE, BLOOD: Lipase: 5842 U/L — ABNORMAL HIGH (ref 11–51)

## 2023-03-02 LAB — URINALYSIS, ROUTINE W REFLEX MICROSCOPIC
Bilirubin Urine: NEGATIVE
Glucose, UA: NEGATIVE mg/dL
Hgb urine dipstick: NEGATIVE
Ketones, ur: NEGATIVE mg/dL
Leukocytes,Ua: NEGATIVE
Nitrite: NEGATIVE
Protein, ur: NEGATIVE mg/dL
Specific Gravity, Urine: >1.046 — ABNORMAL HIGH (ref 1.005–1.030)
pH: 5 (ref 5.0–8.0)

## 2023-03-02 LAB — TRIGLYCERIDES: Triglycerides: 262 mg/dL — ABNORMAL HIGH (ref <150)

## 2023-03-02 MED ORDER — SODIUM CHLORIDE 0.9 % IV BOLUS
500.0000 mL | Freq: Once | INTRAVENOUS | Status: AC
  Administered 2023-03-02: 500 mL via INTRAVENOUS

## 2023-03-02 MED ORDER — ONDANSETRON HCL 4 MG/2ML IJ SOLN
4.0000 mg | Freq: Once | INTRAMUSCULAR | Status: AC
  Administered 2023-03-02: 4 mg via INTRAVENOUS
  Filled 2023-03-02: qty 2

## 2023-03-02 MED ORDER — HYDROMORPHONE HCL 1 MG/ML IJ SOLN
1.0000 mg | INTRAMUSCULAR | Status: DC | PRN
  Administered 2023-03-02 – 2023-03-03 (×2): 1 mg via INTRAVENOUS
  Filled 2023-03-02 (×2): qty 1

## 2023-03-02 MED ORDER — SENNOSIDES-DOCUSATE SODIUM 8.6-50 MG PO TABS
1.0000 | ORAL_TABLET | Freq: Every evening | ORAL | Status: DC | PRN
  Administered 2023-03-05: 1 via ORAL
  Filled 2023-03-02: qty 1

## 2023-03-02 MED ORDER — FENTANYL CITRATE PF 50 MCG/ML IJ SOSY
50.0000 ug | PREFILLED_SYRINGE | Freq: Once | INTRAMUSCULAR | Status: AC
  Administered 2023-03-02: 50 ug via INTRAVENOUS
  Filled 2023-03-02: qty 1

## 2023-03-02 MED ORDER — HEPARIN SODIUM (PORCINE) 5000 UNIT/ML IJ SOLN
5000.0000 [IU] | Freq: Three times a day (TID) | INTRAMUSCULAR | Status: DC
  Administered 2023-03-02 – 2023-03-05 (×9): 5000 [IU] via SUBCUTANEOUS
  Filled 2023-03-02 (×9): qty 1

## 2023-03-02 MED ORDER — ONDANSETRON HCL 4 MG PO TABS: 4.0000 mg | ORAL_TABLET | Freq: Four times a day (QID) | ORAL | Status: AC | PRN

## 2023-03-02 MED ORDER — LORAZEPAM 2 MG/ML IJ SOLN: 0.5000 mg | Freq: Four times a day (QID) | INTRAMUSCULAR | Status: AC | PRN

## 2023-03-02 MED ORDER — LACTATED RINGERS IV SOLN: INTRAVENOUS | Status: AC

## 2023-03-02 MED ORDER — SODIUM CHLORIDE 0.9 % IV SOLN
12.5000 mg | Freq: Four times a day (QID) | INTRAVENOUS | Status: AC | PRN
  Filled 2023-03-02: qty 0.5

## 2023-03-02 MED ORDER — ACETAMINOPHEN 650 MG RE SUPP: 650.0000 mg | Freq: Four times a day (QID) | RECTAL | Status: AC | PRN

## 2023-03-02 MED ORDER — HYDRALAZINE HCL 20 MG/ML IJ SOLN
5.0000 mg | Freq: Three times a day (TID) | INTRAMUSCULAR | Status: AC | PRN
  Administered 2023-03-05: 5 mg via INTRAVENOUS
  Filled 2023-03-02: qty 1

## 2023-03-02 MED ORDER — IOHEXOL 300 MG/ML  SOLN
100.0000 mL | Freq: Once | INTRAMUSCULAR | Status: AC | PRN
  Administered 2023-03-02: 100 mL via INTRAVENOUS

## 2023-03-02 MED ORDER — LACTATED RINGERS IV BOLUS
1000.0000 mL | Freq: Once | INTRAVENOUS | Status: AC
  Administered 2023-03-02: 1000 mL via INTRAVENOUS

## 2023-03-02 MED ORDER — FENTANYL CITRATE PF 50 MCG/ML IJ SOSY
50.0000 ug | PREFILLED_SYRINGE | INTRAMUSCULAR | Status: AC | PRN
  Administered 2023-03-03: 50 ug via INTRAVENOUS
  Filled 2023-03-02 (×2): qty 1

## 2023-03-02 MED ORDER — ACETAMINOPHEN 325 MG PO TABS
650.0000 mg | ORAL_TABLET | Freq: Four times a day (QID) | ORAL | Status: AC | PRN
  Administered 2023-03-06: 650 mg via ORAL
  Filled 2023-03-02: qty 2

## 2023-03-02 MED ORDER — HYDROMORPHONE HCL 1 MG/ML IJ SOLN
1.0000 mg | Freq: Once | INTRAMUSCULAR | Status: AC
  Administered 2023-03-02: 1 mg via INTRAVENOUS
  Filled 2023-03-02: qty 1

## 2023-03-02 MED ORDER — ORAL CARE MOUTH RINSE: 15.0000 mL | OROMUCOSAL | Status: DC | PRN

## 2023-03-02 MED ORDER — ONDANSETRON HCL 4 MG/2ML IJ SOLN
4.0000 mg | Freq: Four times a day (QID) | INTRAMUSCULAR | Status: AC | PRN
  Administered 2023-03-02 – 2023-03-03 (×3): 4 mg via INTRAVENOUS
  Filled 2023-03-02 (×3): qty 2

## 2023-03-02 MED ORDER — MORPHINE SULFATE (PF) 4 MG/ML IV SOLN
4.0000 mg | INTRAVENOUS | Status: AC | PRN
  Administered 2023-03-02 – 2023-03-03 (×3): 4 mg via INTRAVENOUS
  Filled 2023-03-02 (×3): qty 1

## 2023-03-02 MED ORDER — MORPHINE SULFATE (PF) 4 MG/ML IV SOLN
6.0000 mg | Freq: Once | INTRAVENOUS | Status: AC
  Administered 2023-03-02: 6 mg via INTRAVENOUS
  Filled 2023-03-02: qty 2

## 2023-03-02 MED ORDER — POTASSIUM CITRATE-CITRIC ACID 1100-334 MG/5ML PO SOLN
10.0000 meq | Freq: Three times a day (TID) | ORAL | Status: AC
  Administered 2023-03-02 – 2023-03-03 (×2): 10 meq via ORAL
  Filled 2023-03-02 (×2): qty 5

## 2023-03-02 NOTE — H&P (Addendum)
History and Physical   Vanessa Rhodes BJY:[id number] DOB: Jun 22, 1960 DOA: 03/02/2023  PCP: Melida Quitter, PA  Patient coming from: Home  I have personally briefly reviewed patient's old medical records in [facility] EMR.  Chief Concern: Abdominal pain, nausea, vomiting  HPI: Ms. Vanessa Rhodes is a 62 year old female with history of hyperlipidemia, hypertension, hypertriglyceridemia, morbid obesity, who presents emergency department for chief concerns of abdominal pain and nausea.  Vitals in the emergency department showed temperature of 97.6, respiration rate of 15, heart rate 76, blood pressure 101/65, SpO2 of 95% on room air.  Serum sodium is 140, potassium 2.6, chloride 103, bicarb 23, BUN of 13, serum creatinine of 0.90, EGFR greater than 60, nonfasting blood glucose 150, WBC 10.1, hemoglobin 15.8, platelets of 288.  Triglyceride add-on is pending Lipase is in process.  CT abdomen pelvis with contrast: Read as worsening changes of acute pancreatitis with increasing peripancreatic inflammation and enlarging acute Perry pancreatic fluid collection.  No evidence of pancreatic hemorrhage, necrosis, walled off fluid collection. Abnormal heterogenous thickening of the endometrium for age, potentially neoplastic.  Recommend further evaluation with nonemergent pelvic ultrasound.  ED treatment: Ondansetron 4 mg IV one-time dose, fentanyl 50 mcg IV one-time dose, Dilaudid 1 mg IV one-time dose, morphine 6 mg IV one-time dose, sodium chloride 500 mL bolus, LR 1 L bolus. -------------------------------- At bedside, patient is awake alert and oriented x 3.  She reports that her last EtOH drink was 1 beer about 3 to 4 weeks ago.  Patient had developed abdominal pain then and was diagnosed with acute pancreatitis outpatient.  Since then she has been limiting her fat/cholesterol intake and has had a lean diet.  She has not had any alcohol since then.  Today the abdominal pain became worse  along with nausea, prompting her to present to the emergency department for further evaluation.  Social history: She lives at home.  She denies tobacco, EtOH, recreational drug use.  ROS: Constitutional: no weight change, no fever ENT/Mouth: no sore throat, no rhinorrhea Eyes: no eye pain, no vision changes Cardiovascular: no chest pain, no dyspnea,  no edema, no palpitations Respiratory: no cough, no sputum, no wheezing Gastrointestinal: + nausea, no vomiting, no diarrhea, no constipation Genitourinary: no urinary incontinence, no dysuria, no hematuria Musculoskeletal: no arthralgias, no myalgias Skin: no skin lesions, no pruritus, Neuro: + weakness, no loss of consciousness, no syncope Psych: no anxiety, no depression, + decrease appetite Heme/Lymph: no bruising, no bleeding  ED Course: Discussed with emergency medicine provider, patient requiring hospitalization for chief concerns of acute pancreatitis.  Assessment/Plan  Principal Problem:   Acute pancreatitis Active Problems:   Hyperlipidemia LDL goal <100   Essential hypertension   Anxiety   Morbid obesity (HCC)   Increased endometrial stripe thickness   Hypokalemia   Assessment and Plan:  * Acute pancreatitis Lipase in process Check triglyceride, add-on to prior collection, if greater than 500, will initiate insulin via Endo tool Patient is status post sodium chloride infusion one-time dose, LR 1 L bolus per EDP On admission I have ordered additional LR 1 L bolus followed by LR infusion at 150 mL/h, 1 day ordered N.p.o. with orders to advance diet as tolerated to clear liquid Symptomatic support: Morphine 4 mg IV every 4 hours as needed for moderate pain, 1 day ordered; Dilaudid 50 mcg IV every 4 hours as needed for severe pain, 20 hours of coverage ordered; Dilaudid 1 mg IV every 4 hours as needed for pain not responsive  to IV morphine or fentanyl, 20 hours of coverage ordered AM team to re-evaluate patient at bedside  for continued IV opioid pain requirements  Hypokalemia Polycitra 10 mEq, 2 doses ordered Check magnesium level in the a.m.  Increased endometrial stripe thickness On CT abdomen pelvis with contrast imaging Discussed extensively with patient and her daughter at bedside and recommended outpatient follow-up with OB/GYN for nonemergent pelvic ultrasound Both patient and daughter Vanessa Rhodes) was able to repeat the instructions and endorses understanding and compliance  Morbid obesity (HCC) This meets criteria for morbid obesity based on the presence of 1 or more chronic comorbidities. Patient has htn and BMI of 35.5. This complicates overall care and prognosis.   Anxiety Lorazepam 0.5 mg IV every 6 hours as needed for anxiety, 1 day ordered  Essential hypertension Patient takes lisinopril 10 mg daily, not resumed on admission due to greater than or equal to 1% chance of causing pancreatitis Hydralazine 5 mg IV every 8 hours as needed for SBP greater 170, 7 days ordered  Chart reviewed.   DVT prophylaxis: Heparin 5000 units subcutaneous every 8 hours Code Status: Full code Diet: N.p.o.; advance as tolerated to full liquids Family Communication: Updated daughter, Vanessa Rhodes at bedside with patient's permission Disposition Plan: Pending clinical course Consults called: None at this time Admission status: Telemetry medical, inpatient  History reviewed. No pertinent past medical history. Past Surgical History:  Procedure Laterality Date   KNEE SURGERY Left 2007   TUBAL LIGATION  1979   Social History:  reports that she quit smoking about 18 years ago. Her smoking use included cigarettes. She has quit using smokeless tobacco. She reports current alcohol use. She reports that she does not use drugs.  No Known Allergies Family History  Problem Relation Age of Onset   Pulmonary disease Father    Family history: Family history reviewed and not pertinent.  Prior to Admission medications    Medication Sig Start Date End Date Taking? Authorizing Provider  lisinopril (ZESTRIL) 10 MG tablet Take 1 tablet by mouth once daily 02/25/23   Saralyn Pilar A, PA  ondansetron (ZOFRAN-ODT) 8 MG disintegrating tablet Take 1 tablet (8 mg total) by mouth every 8 (eight) hours as needed for nausea or vomiting. 02/13/23   Sandre Kitty, MD   Physical Exam: Vitals:   03/02/23 1245 03/02/23 1330 03/02/23 1448 03/02/23 1453  BP:  133/69  (!) 96/59  Pulse: 65 72  90  Resp: 17 16  16   Temp:    98 F (36.7 C)  TempSrc:      SpO2: 95% 96%  98%  Weight:   90.3 kg   Height:   5\' 4"  (1.626 m)    Constitutional: appears age-appropriate, acutely ill and uncomfortable, calm Eyes: PERRL, lids and conjunctivae normal ENMT: Mucous membranes are moist. Posterior pharynx clear of any exudate or lesions. Age-appropriate dentition. Hearing appropriate Neck: normal, supple, no masses, no thyromegaly Respiratory: clear to auscultation bilaterally, no wheezing, no crackles. Normal respiratory effort. No accessory muscle use.  Cardiovascular: Regular rate and rhythm, no murmurs / rubs / gallops. No extremity edema. 2+ pedal pulses. No carotid bruits.  Abdomen: + Generalized abdominal tenderness, no masses palpated, no hepatosplenomegaly. Bowel sounds positive.  Musculoskeletal: no clubbing / cyanosis. No joint deformity upper and lower extremities. Good ROM, no contractures, no atrophy. Normal muscle tone.  Skin: no rashes, lesions, ulcers. No induration Neurologic: Sensation intact. Strength 5/5 in all 4.  Psychiatric: Normal judgment and insight. Alert and oriented x  3. Normal mood.   EKG: independently reviewed, showing sinus rhythm with rate of 85, QTc 456  Chest x-ray on Admission: I personally reviewed and I agree with radiologist reading as below.  CT ABDOMEN PELVIS W CONTRAST  Result Date: 03/02/2023 CLINICAL DATA:  Abdominal pain, acute, nonlocalized. History of pancreatitis. Abdominal pain and  nausea this morning. EXAM: CT ABDOMEN AND PELVIS WITH CONTRAST TECHNIQUE: Multidetector CT imaging of the abdomen and pelvis was performed using the standard protocol following bolus administration of intravenous contrast. RADIATION DOSE REDUCTION: This exam was performed according to the departmental dose-optimization program which includes automated exposure control, adjustment of the mA and/or kV according to patient size and/or use of iterative reconstruction technique. CONTRAST:  OMNIPAQUE IOHEXOL 300 MG/ML  SOLN COMPARISON:  Abdominopelvic CT 02/14/2023. No other comparison studies. FINDINGS: Lower chest: Mildly increased dependent atelectasis at both lung bases. No significant pleural or pericardial effusion. Hepatobiliary: The liver is normal in density without suspicious focal abnormality. No evidence of gallstones, gallbladder wall thickening or biliary dilatation. Pancreas: Worsening changes of acute pancreatitis with increasing peripancreatic inflammation and ill-defined fluid tracking around the duodenum and left anterior pararenal space. No evidence of pancreatic hemorrhage, necrosis or biliary ductal dilatation. No walled-off fluid collections are identified. Spleen: Normal in size without focal abnormality. Adrenals/Urinary Tract: Both adrenal glands appear normal. No evidence of urinary tract calculus, suspicious renal lesion or hydronephrosis. The bladder appears unremarkable for its degree of distention. Stomach/Bowel: No enteric contrast administered. The stomach appears unremarkable for its degree of distension. No evidence of bowel wall thickening, distention or surrounding inflammatory change. The appendix is not clearly visualized, although there is no pericecal inflammation to suggest appendicitis. Vascular/Lymphatic: There are no enlarged abdominal or pelvic lymph nodes. Aortic and branch vessel atherosclerosis without evidence of aneurysm or large vessel occlusion. The portal,  superior mesenteric and splenic veins remain patent. Reproductive: Abnormal heterogeneous thickening of the endometrium to 1.4 cm for age. The uterus otherwise appears unremarkable. No adnexal mass. Other: No evidence of abdominal wall mass or hernia. No ascites or pneumoperitoneum. Musculoskeletal: No acute or significant osseous findings. Mild multilevel spondylosis. Unless specific follow-up recommendations are mentioned in the findings or impression sections, no imaging follow-up of any mentioned incidental findings is recommended. IMPRESSION: 1. Worsening changes of acute pancreatitis with increasing peripancreatic inflammation and enlarging acute peripancreatic fluid collections. No evidence of pancreatic hemorrhage, necrosis or walled-off fluid collection. 2. No biliary dilatation. 3. Abnormal heterogeneous thickening of the endometrium for age to 1.4 cm, potentially neoplastic. Recommend further evaluation with nonemergent pelvic ultrasound. 4.  Aortic Atherosclerosis (ICD10-I70.0). Electronically Signed   By: Carey Bullocks M.D.   On: 03/02/2023 13:28    Labs on Admission: I have personally reviewed following labs  CBC: Recent Labs  Lab 03/02/23 1129  WBC 10.1  HGB 15.8*  HCT 46.1*  MCV 87.5  PLT 288   Basic Metabolic Panel: Recent Labs  Lab 03/02/23 1129  NA 140  K 3.4*  CL 103  CO2 23  GLUCOSE 150*  BUN 13  CREATININE 0.90  CALCIUM 9.5   GFR: Estimated Creatinine Clearance: 68.7 mL/min (by C-G formula based on SCr of 0.9 mg/dL).  Liver Function Tests: Recent Labs  Lab 03/02/23 1129  AST 47*  ALT 39  ALKPHOS 56  BILITOT 1.2  PROT 7.2  ALBUMIN 4.3   This document was prepared using Dragon Voice Recognition software and may include unintentional dictation errors.  Dr. Sedalia Muta [facility]  If 7PM-7AM, please  contact overnight-coverage provider If 7AM-7PM, please contact day attending provider [email or web address]  03/02/2023, 3:38 PM

## 2023-03-02 NOTE — Assessment & Plan Note (Signed)
On CT abdomen pelvis with contrast imaging Discussed extensively with patient and her daughter at bedside and recommended outpatient follow-up with OB/GYN for nonemergent pelvic ultrasound Both patient and daughter [name]) was able to repeat the instructions and endorses understanding and compliance

## 2023-03-02 NOTE — Hospital Course (Signed)
Vanessa Rhodes is a 63 year old female with history of hyperlipidemia, hypertension, hypertriglyceridemia, morbid obesity, who presents emergency department for chief concerns of abdominal pain and nausea.  Vitals in the emergency department showed temperature of 97.6, respiration rate of 15, heart rate 76, blood pressure 101/65, SpO2 of 95% on room air.  Serum sodium is 140, potassium 2.6, chloride 103, bicarb 23, BUN of 13, serum creatinine of 0.90, EGFR greater than 60, nonfasting blood glucose 150, WBC 10.1, hemoglobin 15.8, platelets of 288.  Triglyceride add-on is pending Lipase is in process.  CT abdomen pelvis with contrast: Read as worsening changes of acute pancreatitis with increasing peripancreatic inflammation and enlarging acute Perry pancreatic fluid collection.  No evidence of pancreatic hemorrhage, necrosis, walled off fluid collection. Abnormal heterogenous thickening of the endometrium for age, potentially neoplastic.  Recommend further evaluation with nonemergent pelvic ultrasound.  ED treatment: Ondansetron 4 mg IV one-time dose, fentanyl 50 mcg IV one-time dose, Dilaudid 1 mg IV one-time dose, morphine 6 mg IV one-time dose, sodium chloride 500 mL bolus, LR 1 L bolus.

## 2023-03-02 NOTE — ED Triage Notes (Signed)
Pt /co of abd pain and nausea that started this morning. States she had acute pancreatitis 3 wks ago.

## 2023-03-02 NOTE — Assessment & Plan Note (Signed)
Lorazepam 0.5 mg IV every 6 hours as needed for anxiety, 1 day ordered

## 2023-03-02 NOTE — Plan of Care (Signed)
  Problem: Pain Managment: Goal: General experience of comfort will improve Outcome: Not Progressing   Problem: Education: Goal: Knowledge of General Education information will improve Description: Including pain rating scale, medication(s)/side effects and non-pharmacologic comfort measures Outcome: Progressing   Problem: Health Behavior/Discharge Planning: Goal: Ability to manage health-related needs will improve Outcome: Progressing   Problem: Clinical Measurements: Goal: Ability to maintain clinical measurements within normal limits will improve Outcome: Progressing Goal: Will remain free from infection Outcome: Progressing Goal: Diagnostic test results will improve Outcome: Progressing Goal: Respiratory complications will improve Outcome: Progressing Goal: Cardiovascular complication will be avoided Outcome: Progressing   Problem: Activity: Goal: Risk for activity intolerance will decrease Outcome: Progressing   Problem: Nutrition: Goal: Adequate nutrition will be maintained Outcome: Progressing   Problem: Coping: Goal: Level of anxiety will decrease Outcome: Progressing   Problem: Elimination: Goal: Will not experience complications related to bowel motility Outcome: Progressing Goal: Will not experience complications related to urinary retention Outcome: Progressing   Problem: Safety: Goal: Ability to remain free from injury will improve Outcome: Progressing   Problem: Skin Integrity: Goal: Risk for impaired skin integrity will decrease Outcome: Progressing   

## 2023-03-02 NOTE — Assessment & Plan Note (Signed)
This meets criteria for morbid obesity based on the presence of 1 or more chronic comorbidities. Patient has htn and BMI of 35.5. This complicates overall care and prognosis.

## 2023-03-02 NOTE — ED Provider Notes (Signed)
[facility] Provider Note    Event Date/Time   First MD Initiated Contact with Patient 03/02/23 1133     (approximate)   History   Abdominal Pain   HPI  Vanessa Rhodes is a 62 y.o. female presents to the emergency department with abdominal pain.  Abdominal pain started today associated with nausea and vomiting.  States that her pain feels like it is radiating to her back.  Denies any diarrhea or blood in her stool.  Similar situation approximately 3 weeks ago.  States that she had 1 beer and the following day had severe abdominal pain associate with nausea and vomiting.  Followed up with her primary care physician office and had a CT scan done as an outpatient of her abdomen and pelvis and was diagnosed with acute pancreatitis.  States that her symptoms improved but then they returned today and are significantly worse compared to her symptoms 3 weeks ago.  No alcohol for the past 4 weeks.  Does not drink on a regular basis.  No new medications.  No history of gallbladder issues.  No new medications.     Physical Exam   Triage Vital Signs: ED Triage Vitals  Encounter Vitals Group     BP 03/02/23 1124 92/61     Systolic BP Percentile --      Diastolic BP Percentile --      Pulse Rate 03/02/23 1124 (!) 113     Resp 03/02/23 1124 17     Temp 03/02/23 1124 97.6 F (36.4 C)     Temp Source 03/02/23 1124 Oral     SpO2 03/02/23 1124 93 %     Weight 03/02/23 1126 206 lb 12.7 oz (93.8 kg)     Height 03/02/23 1126 5\' 4"  (1.626 m)     Head Circumference --      Peak Flow --      Pain Score 03/02/23 1126 10     Pain Loc --      Pain Education --      Exclude from Growth Chart --     Most recent vital signs: Vitals:   03/02/23 1245 03/02/23 1330  BP:  133/69  Pulse: 65 72  Resp: 17 16  Temp:    SpO2: 95% 96%    Physical Exam Constitutional:      General: She is in acute distress.     Appearance: She is well-developed.  HENT:     Head:  Atraumatic.  Eyes:     Conjunctiva/sclera: Conjunctivae normal.  Cardiovascular:     Rate and Rhythm: Regular rhythm.  Pulmonary:     Effort: No respiratory distress.  Abdominal:     General: There is no distension.     Tenderness: There is abdominal tenderness in the right upper quadrant and epigastric area.  Musculoskeletal:        General: Normal range of motion.     Cervical back: Normal range of motion.  Skin:    General: Skin is warm.  Neurological:     Mental Status: She is alert. Mental status is at baseline.     IMPRESSION / MDM / ASSESSMENT AND PLAN / ED COURSE  I reviewed the triage vital signs and the nursing notes.  Differential diagnosis including acute pancreatitis, acute cholecystitis, choledocholithiasis, malignancy  EKG  I, Corena Herter, the attending physician, personally viewed and interpreted this ECG.   Rate: 85  Rhythm: Normal sinus  Axis: Normal  Intervals: Normal  ST&T Change: None  No tachycardic or bradycardic dysrhythmias while on cardiac telemetry.  RADIOLOGY I independently reviewed imaging, my interpretation of imaging: CT abdomen and pelvis concerning for acute pancreatitis.  Read as acute pancreatitis with no complications.  No findings of acute cholecystitis and no findings of common bile duct dilation  LABS (all labs ordered are listed, but only abnormal results are displayed) Labs interpreted as -    Labs Reviewed  COMPREHENSIVE METABOLIC PANEL - Abnormal; Notable for the following components:      Result Value   Potassium 3.4 (*)    Glucose, Bld 150 (*)    AST 47 (*)    All other components within normal limits  CBC - Abnormal; Notable for the following components:   RBC 5.27 (*)    Hemoglobin 15.8 (*)    HCT 46.1 (*)    All other components within normal limits  LIPASE, BLOOD  URINALYSIS, ROUTINE W REFLEX MICROSCOPIC  TRIGLYCERIDES     MDM    Clinical picture concerning for acute pancreatitis.  No findings of  acute cholecystitis or choledocholithiasis.  No complicating features of pancreatitis.  Requiring multiple doses of IV pain medication and antiemetics.  Given IV fluids.  Idiopathic acute pancreatitis.  Consulted hospitalist for admission  Discussed incidental finding with the patient and the need for outpatient gynecology follow-up to workup malignancy, patient expressed understanding.   PROCEDURES:  Critical Care performed: No  Procedures  Patient's presentation is most consistent with acute presentation with potential threat to life or bodily function.   MEDICATIONS ORDERED IN ED: Medications  acetaminophen (TYLENOL) tablet 650 mg (has no administration in time range)    Or  acetaminophen (TYLENOL) suppository 650 mg (has no administration in time range)  ondansetron (ZOFRAN) tablet 4 mg (has no administration in time range)    Or  ondansetron (ZOFRAN) injection 4 mg (has no administration in time range)  heparin injection 5,000 Units (has no administration in time range)  promethazine (PHENERGAN) 12.5 mg in sodium chloride 0.9 % 50 mL IVPB (has no administration in time range)  senna-docusate (Senokot-S) tablet 1 tablet (has no administration in time range)  lactated ringers infusion (has no administration in time range)  morphine (PF) 4 MG/ML injection 4 mg (has no administration in time range)  fentaNYL (SUBLIMAZE) injection 50 mcg (has no administration in time range)  HYDROmorphone (DILAUDID) injection 1 mg (has no administration in time range)  lactated ringers bolus 1,000 mL (has no administration in time range)  hydrALAZINE (APRESOLINE) injection 5 mg (has no administration in time range)  fentaNYL (SUBLIMAZE) injection 50 mcg (50 mcg Intravenous Given 03/02/23 1206)  ondansetron (ZOFRAN) injection 4 mg (4 mg Intravenous Given 03/02/23 1205)  sodium chloride 0.9 % bolus 500 mL (0 mLs Intravenous Stopped 03/02/23 1310)  iohexol (OMNIPAQUE) 300 MG/ML solution 100 mL (100 mLs  Intravenous Contrast Given 03/02/23 1250)  morphine (PF) 4 MG/ML injection 6 mg (6 mg Intravenous Given 03/02/23 1316)  lactated ringers bolus 1,000 mL (1,000 mLs Intravenous New Bag/Given 03/02/23 1322)  ondansetron (ZOFRAN) injection 4 mg (4 mg Intravenous Given 03/02/23 1315)  HYDROmorphone (DILAUDID) injection 1 mg (1 mg Intravenous Given 03/02/23 1401)    FINAL CLINICAL IMPRESSION(S) / ED DIAGNOSES   Final diagnoses:  Generalized abdominal pain  Idiopathic acute pancreatitis, unspecified complication status  Enlarged uterus     Rx / DC Orders   ED Discharge Orders     None        Note:  This document was prepared using Dragon voice recognition software and may include unintentional dictation errors.   Corena Herter, MD 03/02/23 1433

## 2023-03-02 NOTE — Assessment & Plan Note (Addendum)
Patient takes lisinopril 10 mg daily, not resumed on admission due to greater than or equal to 1% chance of causing pancreatitis Hydralazine 5 mg IV every 8 hours as needed for SBP greater 170, 7 days ordered

## 2023-03-02 NOTE — Assessment & Plan Note (Signed)
Polycitra 10 mEq, 2 doses ordered Check magnesium level in the a.m.

## 2023-03-02 NOTE — Assessment & Plan Note (Addendum)
Lipase in process Check triglyceride, add-on to prior collection, if greater than 500, will initiate insulin via Endo tool Patient is status post sodium chloride infusion one-time dose, LR 1 L bolus per EDP On admission I have ordered additional LR 1 L bolus followed by LR infusion at 150 mL/h, 1 day ordered N.p.o. with orders to advance diet as tolerated to clear liquid Symptomatic support: Morphine 4 mg IV every 4 hours as needed for moderate pain, 1 day ordered; Dilaudid 50 mcg IV every 4 hours as needed for severe pain, 20 hours of coverage ordered; Dilaudid 1 mg IV every 4 hours as needed for pain not responsive to IV morphine or fentanyl, 20 hours of coverage ordered AM team to re-evaluate patient at bedside for continued IV opioid pain requirements

## 2023-03-02 NOTE — ED Notes (Signed)
Pt husband stating he thinks she has become more distended along the right side of stomach. Pt and family informed pt does have pancreatitis. Dr. Arnoldo Morale made aware, will see shortly.

## 2023-03-02 NOTE — ED Notes (Signed)
Advised nurse that patient has ready bed 

## 2023-03-03 ENCOUNTER — Inpatient Hospital Stay: Payer: 59

## 2023-03-03 DIAGNOSIS — R1084 Generalized abdominal pain: Secondary | ICD-10-CM

## 2023-03-03 DIAGNOSIS — N852 Hypertrophy of uterus: Secondary | ICD-10-CM

## 2023-03-03 DIAGNOSIS — K8500 Idiopathic acute pancreatitis without necrosis or infection: Secondary | ICD-10-CM | POA: Diagnosis not present

## 2023-03-03 LAB — BASIC METABOLIC PANEL
Anion gap: 7 (ref 5–15)
BUN: 13 mg/dL (ref 8–23)
CO2: 25 mmol/L (ref 22–32)
Calcium: 8.4 mg/dL — ABNORMAL LOW (ref 8.9–10.3)
Chloride: 103 mmol/L (ref 98–111)
Creatinine, Ser: 0.57 mg/dL (ref 0.44–1.00)
GFR, Estimated: >60 mL/min (ref >60)
Glucose, Bld: 145 mg/dL — ABNORMAL HIGH (ref 70–99)
Potassium: 3.7 mmol/L (ref 3.5–5.1)
Sodium: 135 mmol/L (ref 135–145)

## 2023-03-03 LAB — CBC
HCT: 39.2 % (ref 36.0–46.0)
Hemoglobin: 13.5 g/dL (ref 12.0–15.0)
MCH: 30.1 pg (ref 26.0–34.0)
MCHC: 34.4 g/dL (ref 30.0–36.0)
MCV: 87.3 fL (ref 80.0–100.0)
Platelets: 196 10*3/uL (ref 150–400)
RBC: 4.49 MIL/uL (ref 3.87–5.11)
RDW: 12.3 % (ref 11.5–15.5)
WBC: 8.7 10*3/uL (ref 4.0–10.5)
nRBC: 0 % (ref 0.0–0.2)

## 2023-03-03 LAB — MAGNESIUM: Magnesium: 1.6 mg/dL — ABNORMAL LOW (ref 1.7–2.4)

## 2023-03-03 MED ORDER — POTASSIUM CHLORIDE CRYS ER 20 MEQ PO TBCR
40.0000 meq | EXTENDED_RELEASE_TABLET | Freq: Once | ORAL | Status: AC
  Administered 2023-03-03: 40 meq via ORAL
  Filled 2023-03-03: qty 2

## 2023-03-03 MED ORDER — MAGNESIUM SULFATE 2 GM/50ML IV SOLN
2.0000 g | Freq: Once | INTRAVENOUS | Status: AC
  Administered 2023-03-03: 2 g via INTRAVENOUS
  Filled 2023-03-03: qty 50

## 2023-03-03 MED ORDER — POTASSIUM CHLORIDE CRYS ER 20 MEQ PO TBCR: 40.0000 meq | EXTENDED_RELEASE_TABLET | ORAL | Status: DC

## 2023-03-03 MED ORDER — HYDROMORPHONE HCL 1 MG/ML IJ SOLN
1.0000 mg | INTRAMUSCULAR | Status: DC | PRN
  Administered 2023-03-03 – 2023-03-06 (×19): 1 mg via INTRAVENOUS
  Filled 2023-03-03 (×22): qty 1

## 2023-03-03 NOTE — Plan of Care (Signed)

## 2023-03-03 NOTE — Progress Notes (Signed)
Pt sent for ultrasound via bed in stable condition.

## 2023-03-03 NOTE — Progress Notes (Signed)
Pt received from ultrasound via bed in stable condition.

## 2023-03-03 NOTE — Progress Notes (Signed)
Progress Note   Patient: Vanessa Rhodes KVQ:[id number] DOB: [birth year] DOA: 03/02/2023     1 DOS: the patient was seen and examined on 03/03/2023     Subjective:  Patient seen and examined at bedside this morning Did have some complaints of epigastric pain this morning however later this afternoon patient was able to tolerate water and subsequently requested for her diet to be advanced to clear liquids Denies nausea vomiting chest pain or cough  Brief hospital course: From HPI "Ms. Vanessa Rhodes is a 62 year old female with history of hyperlipidemia, hypertension, hypertriglyceridemia, morbid obesity, who presents emergency department for chief concerns of abdominal pain and nausea.  Patient has significant lipase elevation and CT scan of the abdomen consistent with acute pancreatitis.  Patient therefore admitted for management of acute pancreatitis.   Assessment and Plan:  Acute pancreatitis Significant lipase elevation CT scan of the abdomen concerning for acute pancreatitis with no abscesses or pseudocyst formation Continue IV fluid Continue as needed pain medication Diet has been advanced to clear liquid today   Hypokalemia Continue repletion and monitoring   Increased endometrial stripe thickness On CT abdomen pelvis with contrast imaging Pelvic ultrasound showed thickened endometrium    Patient will have to follow-up with gynecologist as an outpatient   Morbid obesity (HCC) This meets criteria for morbid obesity based on the presence of 1 or more chronic comorbidities. Patient has htn and BMI of 35.5. This complicates overall care and prognosis.    Anxiety Lorazepam 0.5 mg IV every 6 hours as needed for anxiety, 1 day ordered   Essential hypertension Patient takes lisinopril 10 mg daily, not resumed on admission due to greater than or equal to 1% chance of causing pancreatitis Hydralazine 5 mg IV every 8 hours as needed for SBP greater 170, 7 days ordered   Chart  reviewed.    DVT prophylaxis: Heparin 5000 units subcutaneous every 8 hours  Code Status: Full code    Family Communication: Patient's husband updated at bedside   Disposition Plan: Hopefully home when medically stable  Consults called: None at this time  Admission status: Telemetry medical, inpatient   Physical Exam: ENMT: Mucous membranes are moist.  Neck: normal, supple, no masses, no thyromegaly Respiratory: clear to auscultation bilaterally, no wheezing, no crackles. Normal respiratory effort. No accessory muscle use.  Cardiovascular: Regular rate and rhythm, no murmurs / rubs / gallops. No extremity edema. 2+ pedal pulses. No carotid bruits.  Abdomen: Epigastric tenderness with no rebound Musculoskeletal: no clubbing / cyanosis. No joint deformity upper and lower extremities. Good ROM, no contractures, no atrophy. Normal muscle tone.  Skin: no rashes, lesions, ulcers. No induration Neurologic: Sensation intact. Strength 5/5 in all 4.  Psychiatric: Normal judgment and insight. Alert and oriented x 3. Normal mood.   Vitals:   03/02/23 2051 03/03/23 0444 03/03/23 0851 03/03/23 1644  BP: 116/75 (!) 126/58 123/75 126/86  Pulse: (!) 58 73 89 94  Resp: 16 16 16 20   Temp: 97.6 F (36.4 C) 98.1 F (36.7 C) 98.7 F (37.1 C) 98.7 F (37.1 C)  TempSrc:   Oral   SpO2: 93% 91% 93% (!) 88%  Weight:      Height:        Data Reviewed: I have reviewed patient's CT scan report as shown above, I have personally reviewed patient's previous admission documentation as well as CBC and CMP noted below, I have discussed the case extensively with patient as well as husband present at bedside  Time  spent: 55 minutes  Author: Loyce Dys, MD 03/03/2023 4:44 PM  For on call review [email or web address].

## 2023-03-04 ENCOUNTER — Telehealth: Payer: Self-pay | Admitting: *Deleted

## 2023-03-04 DIAGNOSIS — K8500 Idiopathic acute pancreatitis without necrosis or infection: Secondary | ICD-10-CM | POA: Diagnosis not present

## 2023-03-04 DIAGNOSIS — R1084 Generalized abdominal pain: Secondary | ICD-10-CM | POA: Diagnosis not present

## 2023-03-04 DIAGNOSIS — N852 Hypertrophy of uterus: Secondary | ICD-10-CM | POA: Diagnosis not present

## 2023-03-04 LAB — CBC WITH DIFFERENTIAL/PLATELET
Abs Immature Granulocytes: 0.27 10*3/uL — ABNORMAL HIGH (ref 0.00–0.07)
Basophils Absolute: 0.1 10*3/uL (ref 0.0–0.1)
Basophils Relative: 1 %
Eosinophils Absolute: 0.1 10*3/uL (ref 0.0–0.5)
Eosinophils Relative: 1 %
HCT: 37.8 % (ref 36.0–46.0)
Hemoglobin: 13 g/dL (ref 12.0–15.0)
Immature Granulocytes: 2 %
Lymphocytes Relative: 6 %
Lymphs Abs: 0.8 10*3/uL (ref 0.7–4.0)
MCH: 30.2 pg (ref 26.0–34.0)
MCHC: 34.4 g/dL (ref 30.0–36.0)
MCV: 87.7 fL (ref 80.0–100.0)
Monocytes Absolute: 0.6 10*3/uL (ref 0.1–1.0)
Monocytes Relative: 5 %
Neutro Abs: 11.1 10*3/uL — ABNORMAL HIGH (ref 1.7–7.7)
Neutrophils Relative %: 85 %
Platelets: 170 10*3/uL (ref 150–400)
RBC: 4.31 MIL/uL (ref 3.87–5.11)
RDW: 12.6 % (ref 11.5–15.5)
Smear Review: NORMAL
WBC Morphology: INCREASED
WBC: 12.9 10*3/uL — ABNORMAL HIGH (ref 4.0–10.5)
nRBC: 0 % (ref 0.0–0.2)

## 2023-03-04 LAB — URINALYSIS, COMPLETE (UACMP) WITH MICROSCOPIC
Bilirubin Urine: NEGATIVE
Glucose, UA: NEGATIVE mg/dL
Ketones, ur: 5 mg/dL — AB
Leukocytes,Ua: NEGATIVE
Nitrite: NEGATIVE
Protein, ur: 30 mg/dL — AB
Specific Gravity, Urine: 1.03 (ref 1.005–1.030)
pH: 5 (ref 5.0–8.0)

## 2023-03-04 LAB — BASIC METABOLIC PANEL
Anion gap: 8 (ref 5–15)
BUN: 19 mg/dL (ref 8–23)
CO2: 25 mmol/L (ref 22–32)
Calcium: 8 mg/dL — ABNORMAL LOW (ref 8.9–10.3)
Chloride: 98 mmol/L (ref 98–111)
Creatinine, Ser: 0.6 mg/dL (ref 0.44–1.00)
GFR, Estimated: >60 mL/min (ref >60)
Glucose, Bld: 122 mg/dL — ABNORMAL HIGH (ref 70–99)
Potassium: 4.4 mmol/L (ref 3.5–5.1)
Sodium: 131 mmol/L — ABNORMAL LOW (ref 135–145)

## 2023-03-04 MED ORDER — SODIUM CHLORIDE 0.9 % IV SOLN: INTRAVENOUS | Status: DC

## 2023-03-04 NOTE — Progress Notes (Signed)
Progress Note   Patient: Vanessa Rhodes [id number] DOB: December 27, 1960 DOA: 03/02/2023     2 DOS: the patient was seen and examined on 03/04/2023        Subjective:  Patient seen and examined at bedside this morning in the presence of the daughter Has had some improvement in abdominal pain Denies nausea vomiting Able to tolerate a liquid diet at this time We will advance diet as tolerated   Brief hospital course: From HPI "Vanessa Rhodes is a 62 year old female with history of hyperlipidemia, hypertension, hypertriglyceridemia, morbid obesity, who presents emergency department for chief concerns of abdominal pain and nausea.  Patient has significant lipase elevation and CT scan of the abdomen consistent with acute pancreatitis.  Patient therefore admitted for management of acute pancreatitis.     Assessment and Plan:   Acute pancreatitis Significant lipase elevation CT scan of the abdomen concerning for acute pancreatitis with no abscesses or pseudocyst formation Continue IV fluid Continue as needed pain medication Advance diet as tolerated   Hypokalemia Continue repletion and monitoring   Increased endometrial stripe thickness On CT abdomen pelvis with contrast imaging Pelvic ultrasound showed thickened endometrium     Patient will have to follow-up with gynecologist as an outpatient   Morbid obesity (HCC) This meets criteria for morbid obesity based on the presence of 1 or more chronic comorbidities. Patient has htn and BMI of 35.5. This complicates overall care and prognosis.    Anxiety Continue0.5 mg IV every 6 hours as needed for anxiety, 1 day ordered   Essential hypertension Patient takes lisinopril 10 mg daily, not resumed on admission due to greater than or equal to 1% chance of causing pancreatitis Hydralazine 5 mg IV every 8 hours as needed for SBP greater 170, 7 days ordered Monitor blood pressure closely   DVT prophylaxis: Heparin 5000 units  subcutaneous every 8 hours   Code Status: Full code       Family Communication: Patient's husband updated at bedside    Disposition Plan: Hopefully home when medically stable   Consults called: None at this time   Admission status: Telemetry medical, inpatient     Physical Exam: ENMT: Mucous membranes are moist.  Neck: normal, supple, no masses, no thyromegaly Respiratory: clear to auscultation bilaterally, no wheezing, no crackles. Normal respiratory effort. No accessory muscle use.  Cardiovascular: Regular rate and rhythm, no murmurs / rubs / gallops. No extremity edema. 2+ pedal pulses. No carotid bruits.  Abdomen: Epigastric tenderness with no rebound Musculoskeletal: no clubbing / cyanosis. No joint deformity upper and lower extremities. Good ROM, no contractures, no atrophy. Normal muscle tone.  Skin: no rashes, lesions, ulcers. No induration Neurologic: Sensation intact. Strength 5/5 in all 4.  Psychiatric: Normal judgment and insight. Alert and oriented x 3. Normal mood.        Data Reviewed: I have reviewed patient's vitals below as well as CBC and CMP results, I have also reviewed patient's urinalysis, I have reviewed transition of care manager documentation as well as nursing documentation      Latest Ref Rng & Units 03/04/2023    6:56 AM 03/03/2023    5:04 AM 03/02/2023   11:29 AM  CBC  WBC 4.0 - 10.5 K/uL 12.9  8.7  10.1   Hemoglobin 12.0 - 15.0 g/dL 29.5  62.1  30.8   Hematocrit 36.0 - 46.0 % 37.8  39.2  46.1   Platelets 150 - 400 K/uL 170  196  288  Latest Ref Rng & Units 03/04/2023    6:56 AM 03/03/2023    5:04 AM 03/02/2023   11:29 AM  BMP  Glucose 70 - 99 mg/dL 578  469  629   BUN 8 - 23 mg/dL 19  13  13    Creatinine 0.44 - 1.00 mg/dL 5.28  4.13  2.44   Sodium 135 - 145 mmol/L 131  135  140   Potassium 3.5 - 5.1 mmol/L 4.4  3.7  3.4   Chloride 98 - 111 mmol/L 98  103  103   CO2 22 - 32 mmol/L 25  25  23    Calcium 8.9 - 10.3 mg/dL 8.0  8.4  9.5      Vitals:   03/03/23 2027 03/04/23 0427 03/04/23 0755 03/04/23 1252  BP: (!) 147/75 (!) 152/77 138/73   Pulse: (!) 105 98 99   Resp: 18 17 16 18   Temp: 99.5 F (37.5 C) 98.5 F (36.9 C) 98.2 F (36.8 C)   TempSrc:  Oral Oral   SpO2: 91% 92% 92%   Weight:      Height:         Author: Loyce Dys, MD 03/04/2023 3:49 PM  For on call review [email or web address].

## 2023-03-04 NOTE — Telephone Encounter (Signed)
Since she is admitted to [facility], primary care is not involved in inpatient care at all.  Hospitalists will be able to address her concerns and initiate transfer.

## 2023-03-04 NOTE — Progress Notes (Signed)
Patient is alert and oriented X 4. Her SpO2 dropped to 87 in RA and rose to 91 in 2lit oxygen via nasal cannula, no any SOB noted.Md made aware. No any new order received. Plan of care ongoing.

## 2023-03-04 NOTE — Progress Notes (Signed)
Introduced patient and patient's daughter, who is at bedside, to role of nurse navigator. Patient appears to be very uncomfortable, clutching her upper abd during discussion. Daughter [name], expresses frustrations with perceived lack of communication. Questions answered to best of ability, and MD sent secure chat to request he address questions/concerns. Bedside RN Zella Ball also made aware of patient's request of pain medication and concern with inability to void.   Patient unable to fully complete intake questions related to amount of pain. Will complete at a later time.

## 2023-03-04 NOTE — TOC CM/SW Note (Signed)
Transition of Care [facility]) - Inpatient Brief Assessment   Patient Details  Name: Vanessa Rhodes MRN: [id number] Date of Birth: 21-Sep-1960  Transition of [facility]) CM/SW Contact:    Garret Reddish, RN Phone Number: 03/04/2023, 2:24 PM   Clinical Narrative:  Chart reviewed.  Patient was admitted with Acute pancreatitis. Patient continues to be on IV fluids.  Patient continues to receive pain management and currently on clear liquid diet.    No TOC needs identified  at this time.     Transition of Care Asessment: Insurance and Status: Insurance coverage has been reviewed Patient has primary care physician: Yes Jairo Ben, Simmie Davies, PA) Home environment has been reviewed: Lives at home with spouse Prior level of function:: Independent Prior/Current Home Services: No current home services Social Determinants of Health Reivew: SDOH reviewed no interventions necessary Readmission risk has been reviewed: Yes Transition of care needs: no transition of care needs at this time

## 2023-03-04 NOTE — Telephone Encounter (Signed)
Husband was advised Since she is admitted to [facility], primary care is not involved in inpatient care at all. Hospitalists will be able to address her concerns and initiate transfer.   He understood

## 2023-03-04 NOTE — Telephone Encounter (Signed)
Pt daughter calling stating that pt is in [facility] and is wanting to transfer care to another hospital.  Informed PCP and she said that they should let the hospital know of the desire to switch hospitals.  She said that the other hospital would have to agree to accept the transfer and that the hospital would then arrange for the transfer.  Pt daughter said that she thought the PCP would need to approve this transfer.  Told her to ask to speak to one of the head persons and voice the concerns and to request the process be started.  Routing to PCP as an [place]

## 2023-03-04 NOTE — Plan of Care (Signed)

## 2023-03-05 ENCOUNTER — Inpatient Hospital Stay: Payer: 59

## 2023-03-05 DIAGNOSIS — K8500 Idiopathic acute pancreatitis without necrosis or infection: Secondary | ICD-10-CM | POA: Diagnosis not present

## 2023-03-05 DIAGNOSIS — R1084 Generalized abdominal pain: Secondary | ICD-10-CM | POA: Diagnosis not present

## 2023-03-05 DIAGNOSIS — N852 Hypertrophy of uterus: Secondary | ICD-10-CM | POA: Diagnosis not present

## 2023-03-05 LAB — CBC WITH DIFFERENTIAL/PLATELET
Abs Immature Granulocytes: 0.12 10*3/uL — ABNORMAL HIGH (ref 0.00–0.07)
Basophils Absolute: 0 10*3/uL (ref 0.0–0.1)
Basophils Relative: 0 %
Eosinophils Absolute: 0 10*3/uL (ref 0.0–0.5)
Eosinophils Relative: 0 %
HCT: 35.2 % — ABNORMAL LOW (ref 36.0–46.0)
Hemoglobin: 12.2 g/dL (ref 12.0–15.0)
Immature Granulocytes: 1 %
Lymphocytes Relative: 7 %
Lymphs Abs: 0.6 10*3/uL — ABNORMAL LOW (ref 0.7–4.0)
MCH: 30.3 pg (ref 26.0–34.0)
MCHC: 34.7 g/dL (ref 30.0–36.0)
MCV: 87.3 fL (ref 80.0–100.0)
Monocytes Absolute: 0.3 10*3/uL (ref 0.1–1.0)
Monocytes Relative: 4 %
Neutro Abs: 7.4 10*3/uL (ref 1.7–7.7)
Neutrophils Relative %: 88 %
Platelets: 160 10*3/uL (ref 150–400)
RBC: 4.03 MIL/uL (ref 3.87–5.11)
RDW: 12.4 % (ref 11.5–15.5)
Smear Review: NORMAL
WBC Morphology: INCREASED
WBC: 8.5 10*3/uL (ref 4.0–10.5)
nRBC: 0 % (ref 0.0–0.2)

## 2023-03-05 LAB — BASIC METABOLIC PANEL
Anion gap: 7 (ref 5–15)
BUN: 13 mg/dL (ref 8–23)
CO2: 24 mmol/L (ref 22–32)
Calcium: 7.8 mg/dL — ABNORMAL LOW (ref 8.9–10.3)
Chloride: 96 mmol/L — ABNORMAL LOW (ref 98–111)
Creatinine, Ser: 0.43 mg/dL — ABNORMAL LOW (ref 0.44–1.00)
GFR, Estimated: >60 mL/min (ref >60)
Glucose, Bld: 113 mg/dL — ABNORMAL HIGH (ref 70–99)
Potassium: 3.6 mmol/L (ref 3.5–5.1)
Sodium: 127 mmol/L — ABNORMAL LOW (ref 135–145)

## 2023-03-05 MED ORDER — ALPRAZOLAM 0.5 MG PO TABS
0.5000 mg | ORAL_TABLET | Freq: Three times a day (TID) | ORAL | Status: DC | PRN
  Administered 2023-03-05 – 2023-03-06 (×2): 0.5 mg via ORAL
  Filled 2023-03-05 (×2): qty 1

## 2023-03-05 MED ORDER — ENOXAPARIN SODIUM 60 MG/0.6ML IJ SOSY
0.5000 mg/kg | PREFILLED_SYRINGE | INTRAMUSCULAR | Status: DC
  Administered 2023-03-05 – 2023-03-10 (×6): 45 mg via SUBCUTANEOUS
  Filled 2023-03-05 (×6): qty 0.6

## 2023-03-05 MED ORDER — SODIUM CHLORIDE 0.9 % IV SOLN
500.0000 mg | INTRAVENOUS | Status: AC
  Administered 2023-03-05 – 2023-03-09 (×5): 500 mg via INTRAVENOUS
  Filled 2023-03-05 (×5): qty 5

## 2023-03-05 MED ORDER — IOHEXOL 300 MG/ML  SOLN
100.0000 mL | Freq: Once | INTRAMUSCULAR | Status: AC | PRN
  Administered 2023-03-05: 100 mL via INTRAVENOUS

## 2023-03-05 MED ORDER — IPRATROPIUM-ALBUTEROL 0.5-2.5 (3) MG/3ML IN SOLN
3.0000 mL | Freq: Four times a day (QID) | RESPIRATORY_TRACT | Status: DC | PRN
  Administered 2023-03-05 – 2023-03-07 (×5): 3 mL via RESPIRATORY_TRACT
  Filled 2023-03-05 (×5): qty 3

## 2023-03-05 MED ORDER — SODIUM CHLORIDE 0.9 % IV SOLN
1.0000 g | INTRAVENOUS | Status: AC
  Administered 2023-03-05 – 2023-03-09 (×5): 1 g via INTRAVENOUS
  Filled 2023-03-05 (×5): qty 10

## 2023-03-05 NOTE — Plan of Care (Signed)

## 2023-03-05 NOTE — Progress Notes (Addendum)
Progress Note   Patient: Vanessa Rhodes:[id number] DOB: [birth year] DOA: 03/02/2023     3 DOS: the patient was seen and examined on 03/05/2023  Subjective:  Patient seen and examined at bedside this morning in the presence of the daughter as well as patient's husband Today with consent that patient's abdomen looks distended. I told him I will be getting an abdominal CT scan today to rule out any complications of acute pancreatitis Patient also requiring 1.5 L of intranasal oxygen and so chest x-ray requested   Brief hospital course: From HPI "Ms. Vanessa Rhodes is a 62 year old female with history of hyperlipidemia, hypertension, hypertriglyceridemia, morbid obesity, who presents emergency department for chief concerns of abdominal pain and nausea.  Patient has significant lipase elevation and CT scan of the abdomen consistent with acute pancreatitis.  Patient therefore admitted for management of acute pancreatitis.     Assessment and Plan:   Acute pancreatitis Significant lipase elevation CT scan of the abdomen concerning for acute pancreatitis with no abscesses or pseudocyst formation Continue IV fluid Continue as needed pain medication Advance diet as tolerated Repeat CT scan of the abdomen did not show any findings of abscess, pseudocyst, or vascular compromise.  Community-acquired pneumonia  Patient initiated on ceftriaxone and azithromycin Chest x-ray showing left basilar infiltrate   Hypokalemia-improving Continue repletion and monitoring    Hyponatremia possibly secondary to dehydration Patient appears clinically dehydrated with low amounts of concentrated urine recorded IV fluid increased today We will continue to monitor sodium levels  Increased endometrial stripe thickness On CT abdomen pelvis with contrast imaging Pelvic ultrasound showed thickened endometrium   Patient will have to follow-up with gynecologist as an outpatient   Morbid obesity (HCC) This  meets criteria for morbid obesity based on the presence of 1 or more chronic comorbidities. Patient has htn and BMI of 35.5. This complicates overall care and prognosis.    Anxiety We will give as needed Xanax   Essential hypertension Patient takes lisinopril 10 mg daily, not resumed on admission due to greater than or equal to 1% chance of causing pancreatitis Hydralazine 5 mg IV every 8 hours as needed for SBP greater 170, Monitor blood pressure closely   DVT prophylaxis: Heparin 5000 units subcutaneous every 8 hours   Code Status: Full code       Family Communication: Patient's husband updated at bedside    Disposition Plan: Hopefully home when medically stable   Consults called: None at this time   Admission status: Telemetry medical, inpatient     Physical Exam: ENMT: Mucous membranes are moist.  Neck: normal, supple, no masses, no thyromegaly Respiratory: clear to auscultation bilaterally, no wheezing, no crackles. Normal respiratory effort. No accessory muscle use.  Cardiovascular: Regular rate and rhythm, no murmurs / rubs / gallops. No extremity edema. 2+ pedal pulses. No carotid bruits.  Abdomen: Epigastric tenderness with no rebound Musculoskeletal: no clubbing / cyanosis. No joint deformity upper and lower extremities. Good ROM, no contractures, no atrophy. Normal muscle tone.  Skin: no rashes, lesions, ulcers. No induration Neurologic: Sensation intact. Strength 5/5 in all 4.  Psychiatric: Normal judgment and insight. Alert and oriented x 3. Normal mood.     Data Reviewed: I have reviewed patient's lab results as shown below as well as repeat CT scan of the abdomen that did not show any findings of complications of acute pancreatitis, I have also reviewed nursing documentation    Latest Ref Rng & Units 03/05/2023    5:14 AM  03/04/2023    6:56 AM 03/03/2023    5:04 AM  CBC  WBC 4.0 - 10.5 K/uL 8.5  12.9  8.7   Hemoglobin 12.0 - 15.0 g/dL 09.8  11.9  14.7    Hematocrit 36.0 - 46.0 % 35.2  37.8  39.2   Platelets 150 - 400 K/uL 160  170  196        Latest Ref Rng & Units 03/05/2023    5:14 AM 03/04/2023    6:56 AM 03/03/2023    5:04 AM  BMP  Glucose 70 - 99 mg/dL 829  562  130   BUN 8 - 23 mg/dL 13  19  13    Creatinine 0.44 - 1.00 mg/dL 8.65  7.84  6.96   Sodium 135 - 145 mmol/L 127  131  135   Potassium 3.5 - 5.1 mmol/L 3.6  4.4  3.7   Chloride 98 - 111 mmol/L 96  98  103   CO2 22 - 32 mmol/L 24  25  25    Calcium 8.9 - 10.3 mg/dL 7.8  8.0  8.4     Vitals:   03/04/23 1700 03/04/23 2049 03/05/23 0608 03/05/23 0808  BP: 136/69 (!) 141/74 119/66 (!) 159/90  Pulse: 98 98 91 99  Resp: 18 20 19 18   Temp: 97.6 F (36.4 C) 98.7 F (37.1 C) 99.4 F (37.4 C) 97.9 F (36.6 C)  TempSrc: Oral Oral Oral   SpO2: 94% 94% 95% 93%  Weight:      Height:         Author: Loyce Dys, MD 03/05/2023 12:52 PM  For on call review [email or web address].

## 2023-03-06 DIAGNOSIS — E871 Hypo-osmolality and hyponatremia: Secondary | ICD-10-CM | POA: Diagnosis not present

## 2023-03-06 DIAGNOSIS — I10 Essential (primary) hypertension: Secondary | ICD-10-CM

## 2023-03-06 DIAGNOSIS — K8500 Idiopathic acute pancreatitis without necrosis or infection: Secondary | ICD-10-CM | POA: Diagnosis not present

## 2023-03-06 LAB — CBC WITH DIFFERENTIAL/PLATELET
Abs Immature Granulocytes: 0.09 10*3/uL — ABNORMAL HIGH (ref 0.00–0.07)
Basophils Absolute: 0.1 10*3/uL (ref 0.0–0.1)
Basophils Relative: 1 %
Eosinophils Absolute: 0 10*3/uL (ref 0.0–0.5)
Eosinophils Relative: 0 %
HCT: 33 % — ABNORMAL LOW (ref 36.0–46.0)
Hemoglobin: 11.2 g/dL — ABNORMAL LOW (ref 12.0–15.0)
Immature Granulocytes: 1 %
Lymphocytes Relative: 7 %
Lymphs Abs: 0.6 10*3/uL — ABNORMAL LOW (ref 0.7–4.0)
MCH: 30.3 pg (ref 26.0–34.0)
MCHC: 33.9 g/dL (ref 30.0–36.0)
MCV: 89.2 fL (ref 80.0–100.0)
Monocytes Absolute: 0.6 10*3/uL (ref 0.1–1.0)
Monocytes Relative: 7 %
Neutro Abs: 7.3 10*3/uL (ref 1.7–7.7)
Neutrophils Relative %: 84 %
Platelets: 165 10*3/uL (ref 150–400)
RBC: 3.7 MIL/uL — ABNORMAL LOW (ref 3.87–5.11)
RDW: 12.4 % (ref 11.5–15.5)
Smear Review: NORMAL
WBC: 8.7 10*3/uL (ref 4.0–10.5)
nRBC: 0.2 % (ref 0.0–0.2)

## 2023-03-06 LAB — SODIUM, URINE, RANDOM: Sodium, Ur: 39 mmol/L

## 2023-03-06 LAB — BASIC METABOLIC PANEL
Anion gap: 6 (ref 5–15)
BUN: 10 mg/dL (ref 8–23)
CO2: 25 mmol/L (ref 22–32)
Calcium: 7.6 mg/dL — ABNORMAL LOW (ref 8.9–10.3)
Chloride: 95 mmol/L — ABNORMAL LOW (ref 98–111)
Creatinine, Ser: 0.46 mg/dL (ref 0.44–1.00)
GFR, Estimated: >60 mL/min (ref >60)
Glucose, Bld: 98 mg/dL (ref 70–99)
Potassium: 3.5 mmol/L (ref 3.5–5.1)
Sodium: 126 mmol/L — ABNORMAL LOW (ref 135–145)

## 2023-03-06 LAB — HEPATIC FUNCTION PANEL
ALT: 32 U/L (ref 0–44)
AST: 23 U/L (ref 15–41)
Albumin: 2.5 g/dL — ABNORMAL LOW (ref 3.5–5.0)
Alkaline Phosphatase: 56 U/L (ref 38–126)
Bilirubin, Direct: 0.3 mg/dL — ABNORMAL HIGH (ref 0.0–0.2)
Indirect Bilirubin: 0.6 mg/dL (ref 0.3–0.9)
Total Bilirubin: 0.9 mg/dL (ref 0.3–1.2)
Total Protein: 5.7 g/dL — ABNORMAL LOW (ref 6.5–8.1)

## 2023-03-06 LAB — OSMOLALITY: Osmolality: 260 mosm/kg — ABNORMAL LOW (ref 275–295)

## 2023-03-06 LAB — OSMOLALITY, URINE: Osmolality, Ur: 621 mosm/kg (ref 300–900)

## 2023-03-06 LAB — MAGNESIUM: Magnesium: 2.1 mg/dL (ref 1.7–2.4)

## 2023-03-06 LAB — LIPASE, BLOOD: Lipase: 23 U/L (ref 11–51)

## 2023-03-06 MED ORDER — PANTOPRAZOLE SODIUM 40 MG IV SOLR
40.0000 mg | Freq: Two times a day (BID) | INTRAVENOUS | Status: DC
  Administered 2023-03-06 – 2023-03-11 (×11): 40 mg via INTRAVENOUS
  Filled 2023-03-06 (×11): qty 10

## 2023-03-06 MED ORDER — HYDROMORPHONE HCL 1 MG/ML IJ SOLN
1.0000 mg | INTRAMUSCULAR | Status: DC | PRN
  Administered 2023-03-06 – 2023-03-08 (×7): 1 mg via INTRAVENOUS
  Filled 2023-03-06 (×7): qty 1

## 2023-03-06 MED ORDER — OXYCODONE HCL 5 MG PO TABS
5.0000 mg | ORAL_TABLET | ORAL | Status: DC | PRN
  Administered 2023-03-06 – 2023-03-10 (×16): 10 mg via ORAL
  Filled 2023-03-06 (×19): qty 2

## 2023-03-06 NOTE — Progress Notes (Addendum)
Progress Note   Patient: Vanessa Rhodes ZOX:[id number] DOB: 04-Mar-1961 DOA: 03/02/2023     4 DOS: the patient was seen and examined on 03/06/2023   Subjective:  Patient seen up in recliner with her son and daughter at bedside this AM.  She has severe ongoing pain.  Minimal PO intake, does best with broth and jello.  Aside from pain, pt denies acute complaints. Family express concern for her nutrition with minimal PO intake.    Brief hospital course: From HPI "Ms. Vanessa Rhodes is a 62 year old female with history of hyperlipidemia, hypertension, hypertriglyceridemia, morbid obesity, who presents emergency department for chief concerns of abdominal pain and nausea.  Patient has significant lipase elevation and CT scan of the abdomen consistent with acute pancreatitis.  Patient therefore admitted for management of acute pancreatitis.     Assessment and Plan:   Acute pancreatitis Significant lipase elevation CT scan of the abdomen concerning for acute pancreatitis with no abscesses or pseudocyst formation Stop IV fluid with sodium trending down, concern for hypervolemia --Continue as needed pain medication --Advance diet as tolerated - on full liquids Repeat CT scan of the abdomen did not show any complications including abscess, pseudocyst, or vascular compromise.  Community-acquired pneumonia  Chest x-ray showing left basilar infiltrate --Continue ceftriaxone and azithromycin --Symptomatic care per orders, as needed   Hypokalemia-improving Continue repletion and monitoring   Hyponatremia possibly secondary to dehydration Etiology felt to be dehydration, but pt has been on aggressive fluids since admission for pancreatitis. Net fluid balance is + almost 4 L to date Na trend: 140 >> 135 >> 131 >> 127 >> 126 --Stop IV fluids --Daily weights to assess volume status --Check serum & urine osmolality, urine sodium for determining etiology --Monitor BMP  Increased endometrial stripe  thickness Noted on CT abdomen pelvis with contrast  Pelvic ultrasound showed thickened endometrium   Follow-up with gynecologist as an outpatient   Morbid obesity (HCC) Body mass index is 34.17 kg/m. Complicates overall care and prognosis.  Recommend lifestyle modifications including physical activity and diet for weight loss and overall long-term health.    Anxiety PRN Xanax ordered   Essential hypertension Patient takes lisinopril 10 mg daily, not resumed on admission due to greater than or equal to 1% chance of causing pancreatitis Hydralazine IV PRN Monitor blood pressure closely   DVT prophylaxis: Heparin 5000 units subcutaneous every 8 hours   Code Status: Full code       Family Communication: family at bedside on rounds this AM    Disposition Plan: home when medically stable, ongoing uncontrolled abdominal pain and inadequate PO intake   Consults called: None at this time   Admission status: Telemetry medical, inpatient     Physical Exam: General exam: awake, alert, in acute distress due pain HEENT: moist mucus membranes, hearing grossly normal  Respiratory system: CTAB, no wheezes, rales or rhonchi, normal respiratory effort. Cardiovascular system: normal S1/S2, RRR, o pedal edema.   Gastrointestinal system: mildly distended, diffusely tender with no rebound or guarding, +bowel sounds. Central nervous system: A&O x . no gross focal neurologic deficits, normal speech Extremities: moves all , no edema, normal tone Skin: dry, intact, normal temperature Psychiatry: normal mood, congruent affect, judgement and insight appear normal      Data Reviewed: I have reviewed patient's lab results as shown below as well as repeat CT scan of the abdomen that did not show any findings of complications of acute pancreatitis, I have also reviewed nursing documentation  Latest Ref Rng & Units 03/06/2023    4:28 AM 03/05/2023    5:14 AM 03/04/2023    6:56 AM  CBC  WBC 4.0 -  10.5 K/uL 8.7  8.5  12.9   Hemoglobin 12.0 - 15.0 g/dL 16.1  09.6  04.5   Hematocrit 36.0 - 46.0 % 33.0  35.2  37.8   Platelets 150 - 400 K/uL 165  160  170        Latest Ref Rng & Units 03/06/2023    4:28 AM 03/05/2023    5:14 AM 03/04/2023    6:56 AM  BMP  Glucose 70 - 99 mg/dL 98  409  811   BUN 8 - 23 mg/dL 10  13  19    Creatinine 0.44 - 1.00 mg/dL 9.14  7.82  9.56   Sodium 135 - 145 mmol/L 126  127  131   Potassium 3.5 - 5.1 mmol/L 3.5  3.6  4.4   Chloride 98 - 111 mmol/L 95  96  98   CO2 22 - 32 mmol/L 25  24  25    Calcium 8.9 - 10.3 mg/dL 7.6  7.8  8.0     Vitals:   03/05/23 2126 03/06/23 0353 03/06/23 0940 03/06/23 1216  BP: (!) 157/88 (!) 151/70 (!) 154/56 (!) 162/68  Pulse: 99 92 88   Resp:   20 20  Temp: 98.4 F (36.9 C) 98.3 F (36.8 C) 98 F (36.7 C)   TempSrc: Oral Oral    SpO2: 92% 93% 96%   Weight:      Height:         Author: Pennie Banter, DO 03/06/2023 2:44 PM  For on call review [email or web address].

## 2023-03-06 NOTE — Plan of Care (Signed)

## 2023-03-07 ENCOUNTER — Inpatient Hospital Stay: Payer: 59

## 2023-03-07 DIAGNOSIS — R0602 Shortness of breath: Secondary | ICD-10-CM | POA: Diagnosis not present

## 2023-03-07 DIAGNOSIS — E876 Hypokalemia: Secondary | ICD-10-CM | POA: Diagnosis not present

## 2023-03-07 DIAGNOSIS — K8500 Idiopathic acute pancreatitis without necrosis or infection: Secondary | ICD-10-CM | POA: Diagnosis not present

## 2023-03-07 DIAGNOSIS — R000 Tachycardia, unspecified: Secondary | ICD-10-CM | POA: Diagnosis not present

## 2023-03-07 HISTORY — DX: Other disorders of phosphorus metabolism: E83.39

## 2023-03-07 LAB — PROCALCITONIN: Procalcitonin: 0.49 ng/mL

## 2023-03-07 LAB — BASIC METABOLIC PANEL
Anion gap: 7 (ref 5–15)
BUN: 9 mg/dL (ref 8–23)
CO2: 27 mmol/L (ref 22–32)
Calcium: 7.8 mg/dL — ABNORMAL LOW (ref 8.9–10.3)
Chloride: 92 mmol/L — ABNORMAL LOW (ref 98–111)
Creatinine, Ser: 0.55 mg/dL (ref 0.44–1.00)
GFR, Estimated: >60 mL/min (ref >60)
Glucose, Bld: 106 mg/dL — ABNORMAL HIGH (ref 70–99)
Potassium: 3.4 mmol/L — ABNORMAL LOW (ref 3.5–5.1)
Sodium: 126 mmol/L — ABNORMAL LOW (ref 135–145)

## 2023-03-07 LAB — TROPONIN I (HIGH SENSITIVITY): Troponin I (High Sensitivity): 8 ng/L (ref <18)

## 2023-03-07 LAB — BRAIN NATRIURETIC PEPTIDE: B Natriuretic Peptide: 99.4 pg/mL (ref 0.0–100.0)

## 2023-03-07 LAB — PHOSPHORUS: Phosphorus: 1.6 mg/dL — ABNORMAL LOW (ref 2.5–4.6)

## 2023-03-07 MED ORDER — POTASSIUM PHOSPHATES 15 MMOLE/5ML IV SOLN
30.0000 mmol | Freq: Once | INTRAVENOUS | Status: AC
  Administered 2023-03-07: 30 mmol via INTRAVENOUS
  Filled 2023-03-07: qty 10

## 2023-03-07 MED ORDER — SENNOSIDES-DOCUSATE SODIUM 8.6-50 MG PO TABS
1.0000 | ORAL_TABLET | Freq: Two times a day (BID) | ORAL | Status: DC
  Administered 2023-03-07 – 2023-03-11 (×7): 1 via ORAL
  Filled 2023-03-07 (×7): qty 1

## 2023-03-07 MED ORDER — FUROSEMIDE 10 MG/ML IJ SOLN
40.0000 mg | Freq: Once | INTRAMUSCULAR | Status: AC
  Administered 2023-03-07: 40 mg via INTRAVENOUS
  Filled 2023-03-07: qty 4

## 2023-03-07 MED ORDER — ENSURE MAX PROTEIN PO LIQD
11.0000 [oz_av] | Freq: Every day | ORAL | Status: DC
  Administered 2023-03-08: 11 [oz_av] via ORAL
  Filled 2023-03-07: qty 330

## 2023-03-07 MED ORDER — BISACODYL 5 MG PO TBEC
5.0000 mg | DELAYED_RELEASE_TABLET | Freq: Every day | ORAL | Status: DC | PRN
  Administered 2023-03-08: 5 mg via ORAL
  Filled 2023-03-07: qty 1

## 2023-03-07 MED ORDER — POLYETHYLENE GLYCOL 3350 17 G PO PACK
17.0000 g | PACK | Freq: Every day | ORAL | Status: DC
  Administered 2023-03-07 – 2023-03-11 (×4): 17 g via ORAL
  Filled 2023-03-07 (×4): qty 1

## 2023-03-07 MED ORDER — BISACODYL 10 MG RE SUPP
10.0000 mg | Freq: Every day | RECTAL | Status: DC | PRN
  Administered 2023-03-08: 10 mg via RECTAL
  Filled 2023-03-07: qty 1

## 2023-03-07 MED ORDER — FUROSEMIDE 10 MG/ML IJ SOLN
20.0000 mg | Freq: Once | INTRAMUSCULAR | Status: AC
  Administered 2023-03-07: 20 mg via INTRAVENOUS
  Filled 2023-03-07: qty 2

## 2023-03-07 NOTE — Consult Note (Signed)
Consultation  Referring Provider: Hospitalist     Admit date: 03/02/2023 Consult date    03/07/2023     Reason for Consultation: Pancreatitis              HPI:   Vanessa Rhodes is a 62 y.o. lady with history of hypertension here with acute pancreatitis of unknown cause. Seems to have had issues with pancreatitis in September that was managed at home which worsened and required hospitalization. She has a sister that had pancreatitis but no family history of pancreatic cancer. She quit smoking several years ago. She sporadically would drink but no heavily. The only medicine she takes is lisinopril. Currently she is sleepy from pain medications but daughter provides history.  History reviewed. No pertinent past medical history.  Past Surgical History:  Procedure Laterality Date   KNEE SURGERY Left 2007   TUBAL LIGATION  1979    Family History  Problem Relation Age of Onset   Pulmonary disease Father      Social History   Tobacco Use   Smoking status: Former    Current packs/day: 0.00    Types: Cigarettes    Quit date: 06/04/2004    Years since quitting: 18.7   Smokeless tobacco: Former  Building services engineer status: Never Used  Substance Use Topics   Alcohol use: Yes    Comment: occasional    Drug use: No    Prior to Admission medications   Medication Sig Start Date End Date Taking? Authorizing Provider  lisinopril (ZESTRIL) 10 MG tablet Take 1 tablet by mouth once daily 02/25/23  Yes Edstrom, Morgan A, PA  ondansetron (ZOFRAN-ODT) 8 MG disintegrating tablet Take 1 tablet (8 mg total) by mouth every 8 (eight) hours as needed for nausea or vomiting. Patient not taking: Reported on 03/02/2023 02/13/23   Sandre Kitty, MD    Current Facility-Administered Medications  Medication Dose Route Frequency Provider Last Rate Last Admin   acetaminophen (TYLENOL) tablet 650 mg  650 mg Oral Q6H PRN Cox, Amy N, DO   650 mg at 03/06/23 1324   Or   acetaminophen (TYLENOL) suppository 650  mg  650 mg Rectal Q6H PRN Cox, Amy N, DO       ALPRAZolam Prudy Feeler) tablet 0.5 mg  0.5 mg Oral TID PRN Rosezetta Schlatter T, MD   0.5 mg at 03/06/23 0953   azithromycin (ZITHROMAX) 500 mg in sodium chloride 0.9 % 250 mL IVPB  500 mg Intravenous Q24H Loyce Dys, MD   Stopped at 03/07/23 1623   bisacodyl (DULCOLAX) EC tablet 5 mg  5 mg Oral Daily PRN Esaw Grandchild A, DO       bisacodyl (DULCOLAX) suppository 10 mg  10 mg Rectal Daily PRN Esaw Grandchild A, DO       cefTRIAXone (ROCEPHIN) 1 g in sodium chloride 0.9 % 100 mL IVPB  1 g Intravenous Q24H Rosezetta Schlatter T, MD 200 mL/hr at 03/07/23 1632 Infusion Verify at 03/07/23 1632   enoxaparin (LOVENOX) injection 45 mg  0.5 mg/kg Subcutaneous Q24H Rosezetta Schlatter T, MD   45 mg at 03/06/23 1959   hydrALAZINE (APRESOLINE) injection 5 mg  5 mg Intravenous Q8H PRN Cox, Amy N, DO   5 mg at 03/05/23 1531   HYDROmorphone (DILAUDID) injection 1 mg  1 mg Intravenous Q3H PRN Esaw Grandchild A, DO   1 mg at 03/07/23 1222   ipratropium-albuterol (DUONEB) 0.5-2.5 (3) MG/3ML nebulizer solution 3 mL  3 mL Nebulization Q6H  PRN Loyce Dys, MD   3 mL at 03/07/23 0057   Oral care mouth rinse  15 mL Mouth Rinse PRN Cox, Amy N, DO       oxyCODONE (Oxy IR/ROXICODONE) immediate release tablet 5-10 mg  5-10 mg Oral Q4H PRN Esaw Grandchild A, DO   10 mg at 03/07/23 1534   pantoprazole (PROTONIX) injection 40 mg  40 mg Intravenous Q12H Esaw Grandchild A, DO   40 mg at 03/07/23 0905   polyethylene glycol (MIRALAX / GLYCOLAX) packet 17 g  17 g Oral Daily Regis Bill, MD   17 g at 03/07/23 1524   senna-docusate (Senokot-S) tablet 1 tablet  1 tablet Oral BID Esaw Grandchild A, DO   1 tablet at 03/07/23 1222    Allergies as of 03/02/2023   (No Known Allergies)     Review of Systems:    All systems reviewed and negative except where noted in HPI.  Unable to assess due to sleeping    Physical Exam:  Vital signs in last 24 hours: Temp:  [97.5 F (36.4 C)-98.9 F (37.2  C)] 97.5 F (36.4 C) (10/03 0738) Pulse Rate:  [91-97] 91 (10/03 1534) Resp:  [17-20] 20 (10/03 1534) BP: (113-165)/(62-78) 146/70 (10/03 1534) SpO2:  [92 %-97 %] 96 % (10/03 1534) Weight:  [454 kg] 107 kg (10/03 0500) Last BM Date : 04/01/23 General:   Sleepy from recent pain meds Head:  Normocephalic and atraumatic. Mouth: Mucosa pink moist, no lesions. Neck:  Supple; no masses felt Lungs: No respiratory distress Abdomen:   Flat, soft, nondistended, nontender Msk:  No clubbing or cyanosis. Strength 5/5 Neurologic:  sleepy Skin:  Warm, dry, pink without significant lesions or rashes.  LAB RESULTS: Recent Labs    03/05/23 0514 03/06/23 0428  WBC 8.5 8.7  HGB 12.2 11.2*  HCT 35.2* 33.0*  PLT 160 165   BMET Recent Labs    03/05/23 0514 03/06/23 0428 03/07/23 0527  NA 127* 126* 126*  K 3.6 3.5 3.4*  CL 96* 95* 92*  CO2 24 25 27   GLUCOSE 113* 98 106*  BUN 13 10 9   CREATININE 0.43* 0.46 0.55  CALCIUM 7.8* 7.6* 7.8*   LFT Recent Labs    03/06/23 0905  PROT 5.7*  ALBUMIN 2.5*  AST 23  ALT 32  ALKPHOS 56  BILITOT 0.9  BILIDIR 0.3*  IBILI 0.6   PT/INR No results for input(s): "LABPROT", "INR" in the last 72 hours.  STUDIES: DG Chest Port 1 View  Result Date: 03/07/2023 CLINICAL DATA:  Shortness of breath EXAM: PORTABLE CHEST 1 VIEW COMPARISON:  03/05/2023 FINDINGS: The heart size and mediastinal contours are within normal limits. Both lungs are clear. The visualized skeletal structures are unremarkable. IMPRESSION: No active disease. Electronically Signed   By: Deatra Robinson M.D.   On: 03/07/2023 02:30       Impression / Plan:   62 y/o lady with obesity and hypertension who presented with acute pancreatitis of unknown cause here with persistent pain but on my exam she was overly sedated with pain medication. Poor PO intake  - agree with stopping IV fluids for now - recommend on cutting back on pain medications as she seems overly sedated - if no  improvement in PO intake by tomorrow, would recommend [place] tube placement if possible - counseled family on how every instance of pancreatitis is different and can range to very mild to requiring an extended time in the hospital - further management  of electrolytes per primary team  Please call with any questions or concerns, will continue to follow  Merlyn Lot MD, MPH [facility]

## 2023-03-07 NOTE — Progress Notes (Addendum)
Progress Note   Patient: Vanessa Rhodes ZOX:[id number] DOB: 22-Feb-1961 DOA: 03/02/2023     5 DOS: the patient was seen and examined on 03/07/2023   Subjective:  Patient sleeping soundly in recliner, daughter at bedside when seen this AM.  Pt got short of breath overnight which improved after getting IV Lasix.  Daughter and family concerned pt still having so much pain and requesting GI's input.    Brief hospital course: From HPI "Ms. Vanessa Rhodes is a 62 year old female with history of hyperlipidemia, hypertension, hypertriglyceridemia, morbid obesity, who presents emergency department for chief concerns of abdominal pain and nausea.  Patient has significant lipase elevation and CT scan of the abdomen consistent with acute pancreatitis.  Patient therefore admitted for management of acute pancreatitis.     Assessment and Plan:   Acute pancreatitis & duodenitis Significant lipase elevation on admission 5842 -- normalized to 23 (10/3) CT scan of the abdomen concerning for acute pancreatitis with no abscesses or pseudocyst formation Stop IV fluid with sodium trending down, concern for hypervolemia --Continue as needed pain medication --Started on IV Protonix BID, suspect duodenitis major contributor to her ongoing pain --Advance diet as tolerated - on full liquids Repeat CT scan of the abdomen did not show any complications including abscess, pseudocyst, or vascular compromise.   Community-acquired pneumonia  Chest x-ray showing left basilar infiltrate --Continue ceftriaxone and azithromycin --Symptomatic care per orders, as needed    Acute respiratory failure with hypoxia - due to pneumonia and likely fluid overload --Mgmt of underlying issues as outlined --Supplement O2 to keep sats > 90% & wean as tolerated --Incentive spirometer   Acute metabolic encephalopathy - suspect hospital delirium, and related to pain meds Daughter reports minimal "lucid" conversation with pt past two  days. --Check VBG to rule out hypercapnia --Check ammonia to rule out hepatic etiology, but very low suspicion --Delirium precautions   Hypokalemia-improving Continue repletion and monitoring  --IV K phos this AM for K 3.4    Hypophosphatemia - phos 1.6 this AM --IV K-phos ordered for replacement --Monitor & replace phos PRN   Hyponatremia -- initially felt to be dehydration, but pt has been on aggressive fluids since admission for pancreatitis and sodium trended down.   Net fluid balance is + almost 4 L to date as of 10/2 Na trend: 140 >> 135 >> 131 >> 127 >> 126 (10/2 fluids stopped, Lasix overnight) >> 126 Hypotonic - serum osmolality 260 --Stay off IV fluids --Repeat IV Lasix this afternoon --Daily weights to assess volume status --Check serum & urine osmolality, urine sodium for determining etiology --Monitor BMP   Increased endometrial stripe thickness Noted on CT abdomen pelvis with contrast  Pelvic ultrasound showed thickened endometrium   Follow-up with gynecologist as an outpatient    Anxiety PRN Xanax ordered    Essential hypertension Patient takes lisinopril 10 mg daily, not resumed on admission due to greater than or equal to 1% chance of causing pancreatitis Hydralazine IV PRN Monitor blood pressure closely    Morbid obesity (HCC) Body mass index is 40.49 kg/m. Complicates overall care and prognosis.  Recommend lifestyle modifications including physical activity and diet for weight loss and overall long-term health.     DVT prophylaxis: Heparin 5000 units subcutaneous every 8 hours    Code Status: Full code     Family Communication: daughter at bedside on rounds this AM    Disposition Plan: home when medically stable, ongoing uncontrolled abdominal pain and inadequate PO intake  Consults called: Gastroenterology consulted 10/3, Dr. Mia Creek   Admission status: Telemetry medical, inpatient     Physical Exam: General exam: sleeping in  recliner, no acute distress HEENT: snoring, eyes closed Respiratory system: CTAB with diminished bases, no wheezes, rales or rhonchi, normal respiratory effort. Cardiovascular system: normal S1/S2, RRR, trace BLE edema Gastrointestinal system: mildly distended, non-tender to light palpation, +bowel sounds. Central nervous system: exam limited by somnolence Extremities: trace BLE edema, normal tone Skin: dry, intact, normal temperature Psychiatry: exam limited by somnolence      Data Reviewed: I have reviewed patient's lab results as shown below as well as repeat CT scan of the abdomen that did not show any findings of complications of acute pancreatitis, I have also reviewed nursing documentation    Latest Ref Rng & Units 03/06/2023    4:28 AM 03/05/2023    5:14 AM 03/04/2023    6:56 AM  CBC  WBC 4.0 - 10.5 K/uL 8.7  8.5  12.9   Hemoglobin 12.0 - 15.0 g/dL 60.4  54.0  98.1   Hematocrit 36.0 - 46.0 % 33.0  35.2  37.8   Platelets 150 - 400 K/uL 165  160  170        Latest Ref Rng & Units 03/07/2023    5:27 AM 03/06/2023    4:28 AM 03/05/2023    5:14 AM  BMP  Glucose 70 - 99 mg/dL 191  98  478   BUN 8 - 23 mg/dL 9  10  13    Creatinine 0.44 - 1.00 mg/dL 2.95  6.21  3.08   Sodium 135 - 145 mmol/L 126  126  127   Potassium 3.5 - 5.1 mmol/L 3.4  3.5  3.6   Chloride 98 - 111 mmol/L 92  95  96   CO2 22 - 32 mmol/L 27  25  24    Calcium 8.9 - 10.3 mg/dL 7.8  7.6  7.8     Vitals:   03/07/23 0200 03/07/23 0437 03/07/23 0500 03/07/23 0738  BP: (!) 145/76 (!) 155/67  113/62  Pulse: 97 95  97  Resp:    17  Temp:  97.6 F (36.4 C)  (!) 97.5 F (36.4 C)  TempSrc:  Oral    SpO2: 96% 95%  97%  Weight:   107 kg   Height:         Author: Pennie Banter, DO 03/07/2023 2:48 PM  For on call review [email or web address].

## 2023-03-07 NOTE — Plan of Care (Signed)

## 2023-03-08 DIAGNOSIS — E876 Hypokalemia: Secondary | ICD-10-CM | POA: Diagnosis not present

## 2023-03-08 DIAGNOSIS — K8500 Idiopathic acute pancreatitis without necrosis or infection: Secondary | ICD-10-CM | POA: Diagnosis not present

## 2023-03-08 LAB — CBC
HCT: 34 % — ABNORMAL LOW (ref 36.0–46.0)
Hemoglobin: 11.7 g/dL — ABNORMAL LOW (ref 12.0–15.0)
MCH: 29.5 pg (ref 26.0–34.0)
MCHC: 34.4 g/dL (ref 30.0–36.0)
MCV: 85.9 fL (ref 80.0–100.0)
Platelets: 169 K/uL (ref 150–400)
RBC: 3.96 MIL/uL (ref 3.87–5.11)
RDW: 12.3 % (ref 11.5–15.5)
WBC: 11.6 K/uL — ABNORMAL HIGH (ref 4.0–10.5)
nRBC: 0.2 % (ref 0.0–0.2)

## 2023-03-08 LAB — BASIC METABOLIC PANEL
Anion gap: 9 (ref 5–15)
BUN: 7 mg/dL — ABNORMAL LOW (ref 8–23)
CO2: 31 mmol/L (ref 22–32)
Calcium: 7.8 mg/dL — ABNORMAL LOW (ref 8.9–10.3)
Chloride: 86 mmol/L — ABNORMAL LOW (ref 98–111)
Creatinine, Ser: 0.42 mg/dL — ABNORMAL LOW (ref 0.44–1.00)
GFR, Estimated: >60 mL/min (ref >60)
Glucose, Bld: 113 mg/dL — ABNORMAL HIGH (ref 70–99)
Potassium: 2.9 mmol/L — ABNORMAL LOW (ref 3.5–5.1)
Sodium: 126 mmol/L — ABNORMAL LOW (ref 135–145)

## 2023-03-08 LAB — BLOOD GAS, VENOUS
Acid-Base Excess: 9.5 mmol/L — ABNORMAL HIGH (ref 0.0–2.0)
Bicarbonate: 33.5 mmol/L — ABNORMAL HIGH (ref 20.0–28.0)
O2 Saturation: 96 %
Patient temperature: 37
pCO2, Ven: 42 mmHg — ABNORMAL LOW (ref 44–60)
pH, Ven: 7.51 — ABNORMAL HIGH (ref 7.25–7.43)
pO2, Ven: 63 mmHg — ABNORMAL HIGH (ref 32–45)

## 2023-03-08 LAB — AMMONIA: Ammonia: 18 umol/L (ref 9–35)

## 2023-03-08 LAB — MAGNESIUM: Magnesium: 2.1 mg/dL (ref 1.7–2.4)

## 2023-03-08 LAB — PHOSPHORUS: Phosphorus: 2.1 mg/dL — ABNORMAL LOW (ref 2.5–4.6)

## 2023-03-08 MED ORDER — ENSURE MAX PROTEIN PO LIQD
11.0000 [oz_av] | Freq: Three times a day (TID) | ORAL | Status: DC
  Administered 2023-03-08 – 2023-03-11 (×8): 11 [oz_av] via ORAL
  Filled 2023-03-08: qty 330

## 2023-03-08 MED ORDER — POTASSIUM CHLORIDE CRYS ER 20 MEQ PO TBCR
40.0000 meq | EXTENDED_RELEASE_TABLET | Freq: Once | ORAL | Status: AC
  Administered 2023-03-08: 40 meq via ORAL
  Filled 2023-03-08: qty 2

## 2023-03-08 MED ORDER — POTASSIUM CHLORIDE CRYS ER 20 MEQ PO TBCR
40.0000 meq | EXTENDED_RELEASE_TABLET | ORAL | Status: AC
  Administered 2023-03-08 (×2): 40 meq via ORAL
  Filled 2023-03-08 (×2): qty 2

## 2023-03-08 MED ORDER — FUROSEMIDE 10 MG/ML IJ SOLN
40.0000 mg | Freq: Two times a day (BID) | INTRAMUSCULAR | Status: DC
  Administered 2023-03-08 – 2023-03-10 (×5): 40 mg via INTRAVENOUS
  Filled 2023-03-08 (×5): qty 4

## 2023-03-08 MED ORDER — ONDANSETRON HCL 4 MG/2ML IJ SOLN
4.0000 mg | Freq: Four times a day (QID) | INTRAMUSCULAR | Status: DC | PRN
  Administered 2023-03-08: 4 mg via INTRAVENOUS
  Filled 2023-03-08: qty 2

## 2023-03-08 MED ORDER — POTASSIUM CHLORIDE 10 MEQ/100ML IV SOLN
10.0000 meq | INTRAVENOUS | Status: DC
  Filled 2023-03-08: qty 100

## 2023-03-08 NOTE — Progress Notes (Signed)
GI Inpatient Follow-up Note  Subjective:  Patient seen and is much more alert today. States she was able to tolerate some ensure.  Scheduled Inpatient Medications:   enoxaparin (LOVENOX) injection  0.5 mg/kg Subcutaneous Q24H   furosemide  40 mg Intravenous BID   pantoprazole (PROTONIX) IV  40 mg Intravenous Q12H   polyethylene glycol  17 g Oral Daily   Ensure Max Protein  11 oz Oral TID with meals   senna-docusate  1 tablet Oral BID    Continuous Inpatient Infusions:    azithromycin Stopped (03/07/23 1623)   cefTRIAXone (ROCEPHIN)  IV 200 mL/hr at 03/07/23 1632    PRN Inpatient Medications:  acetaminophen **OR** acetaminophen, ALPRAZolam, bisacodyl, bisacodyl, hydrALAZINE, HYDROmorphone (DILAUDID) injection, ipratropium-albuterol, ondansetron (ZOFRAN) IV, mouth rinse, oxyCODONE  Review of Systems:  Review of Systems  Constitutional:  Negative for chills and fever.  Respiratory:  Negative for shortness of breath.   Cardiovascular:  Negative for chest pain.  Gastrointestinal:  Positive for abdominal pain and constipation. Negative for blood in stool and melena.  Musculoskeletal:  Negative for joint pain.  Skin:  Negative for itching and rash.  Neurological:  Negative for focal weakness.  Psychiatric/Behavioral:  Negative for substance abuse.   All other systems reviewed and are negative.     Physical Examination: BP (!) 144/78 (BP Location: Left Arm)   Pulse 91   Temp 98.2 F (36.8 C)   Resp 12   Ht 5\' 4"  (1.626 m)   Wt 102.9 kg   SpO2 97%   BMI 38.94 kg/m  Gen: NAD, alert and oriented x 4 HEENT: PEERLA, EOMI, Neck: supple, no JVD or thyromegaly Chest: No respiratory distress Abd: soft, somewhat tender Ext: no edema, well perfused with 2+ pulses, Skin: no rash or lesions noted Lymph: no LAD  Data: Lab Results  Component Value Date   WBC 11.6 (H) 03/08/2023   HGB 11.7 (L) 03/08/2023   HCT 34.0 (L) 03/08/2023   MCV 85.9 03/08/2023   PLT 169 03/08/2023    Recent Labs  Lab 03/05/23 0514 03/06/23 0428 03/08/23 0440  HGB 12.2 11.2* 11.7*   Lab Results  Component Value Date   NA 126 (L) 03/08/2023   K 2.9 (L) 03/08/2023   CL 86 (L) 03/08/2023   CO2 31 03/08/2023   BUN 7 (L) 03/08/2023   CREATININE 0.42 (L) 03/08/2023   Lab Results  Component Value Date   ALT 32 03/06/2023   AST 23 03/06/2023   ALKPHOS 56 03/06/2023   BILITOT 0.9 03/06/2023   No results for input(s): "APTT", "INR", "PTT" in the last 168 hours. Assessment/Plan: Vanessa Rhodes is a 62 y.o. lady with obesity and hypertension who presented with acute pancreatitis of unknown cause here with persistent pain that seems to have improved some today. Tolerated ensure  Recommendations:  - pain control. Ideally would be on just PO meds - if can tolerate multiple ensure daily, may can avoid NJ tube - further management of electrolytes per primary team   Please call with any questions or concerns, will likely follow peripherally   Vanessa Lot MD, MPH [facility]

## 2023-03-08 NOTE — Progress Notes (Signed)
Progress Note   Patient: Vanessa Rhodes IHK:[id number] DOB: 07-26-1960 DOA: 03/02/2023     6 DOS: the patient was seen and examined on 03/08/2023   Subjective:  Pt awake sitting up in bed with husband and son at bedside. She is more alert today.  Still having a great deal of pain.  Nausea this AM after ensure, small amount of breakfast and coffee.  No BM's yet but is passing gas.  Stomach still feels distended but she states less tense than it was.    Brief hospital course: From HPI "Ms. Vanessa Rhodes is a 62 year old female with history of hyperlipidemia, hypertension, hypertriglyceridemia, morbid obesity, who presents emergency department for chief concerns of abdominal pain and nausea.  Patient has significant lipase elevation and CT scan of the abdomen consistent with acute pancreatitis.  Patient therefore admitted for management of acute pancreatitis.     Assessment and Plan:   Acute pancreatitis & duodenitis Significant lipase elevation on admission 5842 -- normalized to 23 (10/3) CT scan of the abdomen concerning for acute pancreatitis with no abscesses or pseudocyst formation Stop IV fluid with sodium trending down, concern for hypervolemia --Continue as needed pain medication --Started on IV Protonix BID, suspect duodenitis major contributor to her ongoing pain --Advance diet to soft - as tolerated. Monitor for worsening pain or nausea --Ensures TID  Repeat CT scan of the abdomen did not show any complications including abscess, pseudocyst, or vascular compromise.   Community-acquired pneumonia  Chest x-ray showing left basilar infiltrate --Continue ceftriaxone and azithromycin --Symptomatic care per orders, as needed    Acute respiratory failure with hypoxia - due to pneumonia and likely fluid overload --Mgmt of underlying issues as outlined --Supplement O2 to keep sats > 90% & wean as tolerated --Incentive spirometer   Acute metabolic encephalopathy - suspect  hospital delirium, and related to pain meds Daughter reports minimal "lucid" conversation with pt past two days. 10/4 - mentation improved when seen this AM, family agree pt more clear & alert today --Check VBG to rule out hypercapnia --Check ammonia to rule out hepatic etiology, but very low suspicion --Delirium precautions   Hypokalemia - recurrent due to diuresis Continue repletion and monitoring  --PO K-CL replacement today for K 2.9    Hypophosphatemia - phos 1.6 on 10/3 was replaced with IV K-phos ordered for replacement --Monitor & replace phos PRN   Hyponatremia -- initially felt to be dehydration, but pt has been on aggressive fluids since admission for pancreatitis and sodium trended down.   Net fluid balance is + almost 4 L to date as of 10/2 Na trend: 140 >> 135 >> 131 >> 127 >> 126 (10/2 fluids stopped, Lasix overnight) >> 126 Hypotonic - serum osmolality 260 --Stay off IV fluids --Continue IV Lasix 40 mg BID --Daily weights to assess volume status --Monitor BMP   Increased endometrial stripe thickness Noted on CT abdomen pelvis with contrast  Pelvic ultrasound showed thickened endometrium   Follow-up with gynecologist as an outpatient    Anxiety PRN Xanax ordered    Essential hypertension Patient takes lisinopril 10 mg daily, not resumed on admission due to greater than or equal to 1% chance of causing pancreatitis Hydralazine IV PRN Monitor blood pressure closely    Morbid obesity (HCC) Body mass index is 40.49 kg/m. Complicates overall care and prognosis.  Recommend lifestyle modifications including physical activity and diet for weight loss and overall long-term health.     DVT prophylaxis: Heparin 5000 units subcutaneous every 8  hours    Code Status: Full code     Family Communication: daughter at bedside on rounds this AM    Disposition Plan: home when medically stable, ongoing uncontrolled abdominal pain and inadequate PO intake    Consults called: Gastroenterology consulted 10/3, Dr. Mia Creek   Admission status: Telemetry medical, inpatient     Physical Exam: General exam: awake & alert, no acute distress Respiratory system: CTAB with diminished bases, no wheezes, rales or rhonchi, normal respiratory effort. Cardiovascular system: normal S1/S2, RRR, trace BLE edema Gastrointestinal system: mildly distended but improved slightly, non-tender to light palpation, +bowel sounds. Central nervous system: non-focal exam, normal speech Extremities: trace BLE edema, normal tone Skin: dry, intact, normal temperature Psychiatry: normal mood & affect, judgment and insight appear intact      Data Reviewed: I have reviewed patient's lab results as shown below as well as repeat CT scan of the abdomen that did not show any findings of complications of acute pancreatitis, I have also reviewed nursing documentation    Latest Ref Rng & Units 03/08/2023    4:40 AM 03/06/2023    4:28 AM 03/05/2023    5:14 AM  CBC  WBC 4.0 - 10.5 K/uL 11.6  8.7  8.5   Hemoglobin 12.0 - 15.0 g/dL 16.1  09.6  04.5   Hematocrit 36.0 - 46.0 % 34.0  33.0  35.2   Platelets 150 - 400 K/uL 169  165  160        Latest Ref Rng & Units 03/08/2023    4:40 AM 03/07/2023    5:27 AM 03/06/2023    4:28 AM  BMP  Glucose 70 - 99 mg/dL 409  811  98   BUN 8 - 23 mg/dL 7  9  10    Creatinine 0.44 - 1.00 mg/dL 9.14  7.82  9.56   Sodium 135 - 145 mmol/L 126  126  126   Potassium 3.5 - 5.1 mmol/L 2.9  3.4  3.5   Chloride 98 - 111 mmol/L 86  92  95   CO2 22 - 32 mmol/L 31  27  25    Calcium 8.9 - 10.3 mg/dL 7.8  7.8  7.6     Vitals:   03/08/23 0500 03/08/23 0532 03/08/23 0755 03/08/23 1548  BP:  126/71 (!) 144/78 108/60  Pulse:  93 91 93  Resp:  14 12 15   Temp:  99.7 F (37.6 C) 98.2 F (36.8 C) 98.9 F (37.2 C)  TempSrc:  Oral    SpO2:  98% 97% 98%  Weight: 102.9 kg     Height:         Author: Pennie Banter, DO 03/08/2023 7:48 PM  For on call  review [email or web address].

## 2023-03-08 NOTE — Progress Notes (Signed)
K+ 2.9   RN notified Manuela Schwartz, NP of the above information  Orders Received: See [facility]

## 2023-03-08 NOTE — Progress Notes (Signed)
       CROSS COVER NOTE  NAME: Vanessa Rhodes MRN: [id number] DOB : 13-May-1961    Concern as stated by nurse / staff   K 2.9     Pertinent findings on chart review:   Assessment and  Interventions   Assessment:  Plan: Potassium 40 mg oral and 40 totoal IV Mag level added to blood in lab     Donnie Mesa NP [facility] Cross Cover 7pm-7am - check amion for availability Pager [phone]

## 2023-03-08 NOTE — Progress Notes (Signed)
       CROSS COVER NOTE  NAME: Vanessa Rhodes MRN: [id number] DOB : 06-07-1960    Concern as stated by nurse / staff   Bedside nurse reports patient with crackles and expiratory wheeze, some increased shortness of breath but no increased oxygen need     Pertinent findings on chart review: Vanessa Rhodes presented to ED 9/28 with abdominal pain and initial CT findings consistent with acute pancreatitis and lipase above 5000, and started on treatment for CAP. She was started on furosemide for these same complaints of shortness of breath and wheezing due to + fluid balance  Assessment and  Interventions   Assessment:    03/08/2023    1:57 AM 03/07/2023   11:54 PM 03/07/2023    9:45 PM  Vitals with BMI  Systolic 117 125 951  Diastolic 63 61 76  Pulse 98 95 107   Bedside patient endorses shortness of breath and abdominal/trunckal fullness;  additionally reports some improvement in sxs after lasix doses earlier Neuro - alert and oriented; gen weakness Resp - sats stable on 2L supplemental O2, no overt distress: exp wheeze and  some crackles throughout CV - generalized edema, tachycardia  BNP - 99.4; troponin 8 Plan: Furosemide 20 mg x1      Vanessa Mesa NP [facility] Cross Cover 7pm-7am - check amion for availability Pager [phone]

## 2023-03-08 NOTE — Plan of Care (Signed)

## 2023-03-09 DIAGNOSIS — E871 Hypo-osmolality and hyponatremia: Secondary | ICD-10-CM | POA: Diagnosis not present

## 2023-03-09 DIAGNOSIS — E876 Hypokalemia: Secondary | ICD-10-CM | POA: Diagnosis not present

## 2023-03-09 DIAGNOSIS — K8500 Idiopathic acute pancreatitis without necrosis or infection: Secondary | ICD-10-CM | POA: Diagnosis not present

## 2023-03-09 LAB — BASIC METABOLIC PANEL
Anion gap: 11 (ref 5–15)
BUN: 9 mg/dL (ref 8–23)
CO2: 31 mmol/L (ref 22–32)
Calcium: 8.2 mg/dL — ABNORMAL LOW (ref 8.9–10.3)
Chloride: 87 mmol/L — ABNORMAL LOW (ref 98–111)
Creatinine, Ser: 0.57 mg/dL (ref 0.44–1.00)
GFR, Estimated: >60 mL/min (ref >60)
Glucose, Bld: 110 mg/dL — ABNORMAL HIGH (ref 70–99)
Potassium: 3.3 mmol/L — ABNORMAL LOW (ref 3.5–5.1)
Sodium: 129 mmol/L — ABNORMAL LOW (ref 135–145)

## 2023-03-09 LAB — CBC
HCT: 35 % — ABNORMAL LOW (ref 36.0–46.0)
Hemoglobin: 12.1 g/dL (ref 12.0–15.0)
MCH: 30.2 pg (ref 26.0–34.0)
MCHC: 34.6 g/dL (ref 30.0–36.0)
MCV: 87.3 fL (ref 80.0–100.0)
Platelets: 196 10*3/uL (ref 150–400)
RBC: 4.01 MIL/uL (ref 3.87–5.11)
RDW: 12.7 % (ref 11.5–15.5)
WBC: 13.5 10*3/uL — ABNORMAL HIGH (ref 4.0–10.5)
nRBC: 0 % (ref 0.0–0.2)

## 2023-03-09 LAB — HEMOGLOBIN A1C
Hgb A1c MFr Bld: 5.4 % (ref 4.8–5.6)
Mean Plasma Glucose: 108.28 mg/dL

## 2023-03-09 LAB — PHOSPHORUS: Phosphorus: 2.5 mg/dL (ref 2.5–4.6)

## 2023-03-09 LAB — PROCALCITONIN: Procalcitonin: 0.44 ng/mL

## 2023-03-09 MED ORDER — KETOROLAC TROMETHAMINE 15 MG/ML IJ SOLN
15.0000 mg | Freq: Four times a day (QID) | INTRAMUSCULAR | Status: DC | PRN
  Administered 2023-03-09 (×3): 15 mg via INTRAVENOUS
  Filled 2023-03-09 (×3): qty 1

## 2023-03-09 MED ORDER — POTASSIUM CHLORIDE CRYS ER 20 MEQ PO TBCR
40.0000 meq | EXTENDED_RELEASE_TABLET | ORAL | Status: AC
  Administered 2023-03-09 (×2): 40 meq via ORAL
  Filled 2023-03-09 (×2): qty 2

## 2023-03-09 MED ORDER — HYDROMORPHONE HCL 1 MG/ML IJ SOLN: 0.5000 mg | INTRAMUSCULAR | Status: DC | PRN

## 2023-03-09 NOTE — Progress Notes (Signed)
Mobility Specialist - Progress Note   03/09/23 1006  Mobility  Activity Ambulated with assistance in hallway;Stood at bedside;Dangled on edge of bed  Level of Assistance Contact guard assist, steadying assist  Assistive Device None  Distance Ambulated (ft) 320 ft  Activity Response Tolerated well  Mobility Referral Yes  $Mobility charge 1 Mobility  Mobility Specialist Start Time (ACUTE ONLY) 0931  Mobility Specialist Stop Time (ACUTE ONLY) 0946  Mobility Specialist Time Calculation (min) (ACUTE ONLY) 15 min   Pt sitting in recliner on 2L upon arrival. Pt STS indep. Pt ambulates in hallway CGA-indep.Pt returns to recliner with needs in reach and daughter present.   Terrilyn Saver  Mobility Specialist  03/09/23 10:08 AM

## 2023-03-09 NOTE — Plan of Care (Signed)

## 2023-03-09 NOTE — Progress Notes (Signed)
Progress Note   Patient: Vanessa Rhodes JYN:[id number] DOB: 03-12-1961 DOA: 03/02/2023     7 DOS: the patient was seen and examined on 03/09/2023   Subjective:  Pt awake, in recliner, daughter at bedside. Other family members just leaving.  Pt reports feeling much better today.  Tolerating ensures and some foods on soft diet.  Hopes to go home soon.    Brief hospital course: From HPI "Vanessa Rhodes is a 62 year old female with history of hyperlipidemia, hypertension, hypertriglyceridemia, morbid obesity, who presents emergency department for chief concerns of abdominal pain and nausea.  Patient has significant lipase elevation and CT scan of the abdomen consistent with acute pancreatitis.  Patient therefore admitted for management of acute pancreatitis.     Assessment and Plan:   Acute pancreatitis & duodenitis Significant lipase elevation on admission 5842 -- normalized to 23 (10/3) CT scan of the abdomen concerning for acute pancreatitis with no abscesses or pseudocyst formation --Off IV fluids, getting diuresis --PO intake improving --Continue as needed pain medication - minimize opiates as much as possible. Added IV toradol. --Started on IV Protonix BID, suspect duodenitis major contributor to her ongoing pain --Advanced diet to soft 10/4 - tolerating well --Ensures TID  Repeat CT scan of the abdomen did not show any complications including abscess, pseudocyst, or vascular compromise.   Community-acquired pneumonia  Chest x-ray showing left basilar infiltrate --Continue ceftriaxone and azithromycin --Symptomatic care per orders, as needed    Acute respiratory failure with hypoxia - due to pneumonia and likely fluid overload --Mgmt of underlying issues as outlined --Supplement O2 to keep sats > 90% & wean as tolerated --Incentive spirometer   Acute metabolic encephalopathy - suspect hospital delirium, and related to pain meds Daughter reports minimal "lucid"  conversation with pt past two days. 10/4--5 - mentation much improved and at baseline --Minimize opiates and any sedating meds --VBG ruled out hypercapnia --Ammonia level normal --Delirium precautions   Hypokalemia - recurrent due to diuresis Continue repletion and monitoring  --PO K-CL replacement today for K 3.3    Hypophosphatemia - phos 1.6 on 10/3 was replaced with IV K-phos ordered for replacement --Monitor & replace phos PRN   Hyponatremia -- initially felt to be dehydration, but pt has been on aggressive fluids since admission for pancreatitis and sodium trended down.   Net fluid balance was + almost 4 L to date as of 10/2 Na improving with diuresis and improving PO intake tolerance: Na 126 >> 129 today --Avoid IV fluids --Continue IV Lasix 40 mg BID --Daily weights to assess volume status --Monitor BMP   Increased endometrial stripe thickness Noted on CT abdomen pelvis with contrast  Pelvic ultrasound showed thickened endometrium   Follow-up with gynecologist as an outpatient    Anxiety PRN Xanax ordered    Essential hypertension Patient takes lisinopril 10 mg daily, not resumed on admission due to greater than or equal to 1% chance of causing pancreatitis Hydralazine IV PRN Monitor blood pressure closely    Morbid obesity (HCC) Body mass index is 40.49 kg/m. Complicates overall care and prognosis.  Recommend lifestyle modifications including physical activity and diet for weight loss and overall long-term health.     DVT prophylaxis: Heparin 5000 units subcutaneous every 8 hours    Code Status: Full code     Family Communication: daughter at bedside on rounds this AM    Disposition Plan: home in 24-48 hours if PO intake and  clinical improvement continues.    Consults called:  Gastroenterology consulted 10/3, Dr. Mia Creek   Admission status: Telemetry medical, inpatient     Physical Exam: General exam: awake & alert, no acute  distress Respiratory system: on room air, normal respiratory effort. Cardiovascular system: RRR, no edema Gastrointestinal system: abdomen softer, improving distention, minimal tenderness on palpation, +bowel sounds. Central nervous system: A&O x 4 non-focal exam, normal speech Extremities: resolved peripheral edema, normal tone Skin: dry, intact, normal temperature Psychiatry: normal mood & affect, judgment and insight appear intact      Data Reviewed: I have reviewed patient's lab results as shown below as well as repeat CT scan of the abdomen that did not show any findings of complications of acute pancreatitis, I have also reviewed nursing documentation    Latest Ref Rng & Units 03/09/2023    6:07 AM 03/08/2023    4:40 AM 03/06/2023    4:28 AM  CBC  WBC 4.0 - 10.5 K/uL 13.5  11.6  8.7   Hemoglobin 12.0 - 15.0 g/dL 09.8  11.9  14.7   Hematocrit 36.0 - 46.0 % 35.0  34.0  33.0   Platelets 150 - 400 K/uL 196  169  165        Latest Ref Rng & Units 03/09/2023    6:07 AM 03/08/2023    4:40 AM 03/07/2023    5:27 AM  BMP  Glucose 70 - 99 mg/dL 829  562  130   BUN 8 - 23 mg/dL 9  7  9    Creatinine 0.44 - 1.00 mg/dL 8.65  7.84  6.96   Sodium 135 - 145 mmol/L 129  126  126   Potassium 3.5 - 5.1 mmol/L 3.3  2.9  3.4   Chloride 98 - 111 mmol/L 87  86  92   CO2 22 - 32 mmol/L 31  31  27    Calcium 8.9 - 10.3 mg/dL 8.2  7.8  7.8     Vitals:   03/09/23 0004 03/09/23 0514 03/09/23 0829 03/09/23 1205  BP: 129/72 125/66 120/69 126/70  Pulse: 94 90 93 90  Resp: 17 17 15 17   Temp: 98.2 F (36.8 C) 98.2 F (36.8 C) 98.3 F (36.8 C) 98 F (36.7 C)  TempSrc:   Oral   SpO2: 92% 97% 97% 98%  Weight:      Height:         Author: Pennie Banter, DO 03/09/2023 3:53 PM  For on call review [email or web address].

## 2023-03-10 DIAGNOSIS — K8500 Idiopathic acute pancreatitis without necrosis or infection: Secondary | ICD-10-CM | POA: Diagnosis not present

## 2023-03-10 DIAGNOSIS — E871 Hypo-osmolality and hyponatremia: Secondary | ICD-10-CM | POA: Diagnosis not present

## 2023-03-10 LAB — BASIC METABOLIC PANEL
Anion gap: 11 (ref 5–15)
BUN: 11 mg/dL (ref 8–23)
CO2: 28 mmol/L (ref 22–32)
Calcium: 8.1 mg/dL — ABNORMAL LOW (ref 8.9–10.3)
Chloride: 92 mmol/L — ABNORMAL LOW (ref 98–111)
Creatinine, Ser: 0.52 mg/dL (ref 0.44–1.00)
GFR, Estimated: >60 mL/min (ref >60)
Glucose, Bld: 102 mg/dL — ABNORMAL HIGH (ref 70–99)
Potassium: 3.8 mmol/L (ref 3.5–5.1)
Sodium: 131 mmol/L — ABNORMAL LOW (ref 135–145)

## 2023-03-10 LAB — CBC
HCT: 33.9 % — ABNORMAL LOW (ref 36.0–46.0)
Hemoglobin: 11.8 g/dL — ABNORMAL LOW (ref 12.0–15.0)
MCH: 29.9 pg (ref 26.0–34.0)
MCHC: 34.8 g/dL (ref 30.0–36.0)
MCV: 86 fL (ref 80.0–100.0)
Platelets: 201 10*3/uL (ref 150–400)
RBC: 3.94 MIL/uL (ref 3.87–5.11)
RDW: 12.6 % (ref 11.5–15.5)
WBC: 10.4 10*3/uL (ref 4.0–10.5)
nRBC: 0 % (ref 0.0–0.2)

## 2023-03-10 MED ORDER — TROLAMINE SALICYLATE 10 % EX CREA
TOPICAL_CREAM | Freq: Two times a day (BID) | CUTANEOUS | Status: DC | PRN
  Filled 2023-03-10: qty 85

## 2023-03-10 MED ORDER — DICLOFENAC SODIUM 1 % EX GEL
2.0000 g | Freq: Four times a day (QID) | CUTANEOUS | Status: DC
  Administered 2023-03-10 (×2): 2 g via TOPICAL
  Filled 2023-03-10: qty 100

## 2023-03-10 NOTE — Plan of Care (Signed)

## 2023-03-10 NOTE — Progress Notes (Signed)
Progress Note   Patient: Vanessa Rhodes:[id number] DOB: Oct 09, 1960 DOA: 03/02/2023     8 DOS: the patient was seen and examined on 03/10/2023   Subjective:  Pt awake resting in bed, husband at bedside this AM.  She feels worse today than yesterday, having increased abdominal pain.  No N/V however.  Otherwise doing better.  Sleeping intermittently due to pain. Hopes to feel well enough to go home soon.    Brief hospital course: From HPI "Ms. Vanessa Rhodes is a 62 year old female with history of hyperlipidemia, hypertension, hypertriglyceridemia, morbid obesity, who presents emergency department for chief concerns of abdominal pain and nausea.  Patient has significant lipase elevation and CT scan of the abdomen consistent with acute pancreatitis.  Patient therefore admitted for management of acute pancreatitis.     Assessment and Plan:   Acute pancreatitis & duodenitis Significant lipase elevation on admission 5842 -- normalized to 23 (10/3) CT scan of the abdomen concerning for acute pancreatitis with no abscesses or pseudocyst formation --Off IV fluids --PO intake improving --Continue as needed pain medication - minimize opiates as much as possible. Added IV toradol. --Started on IV Protonix BID, suspect duodenitis major contributor to her ongoing pain --Advanced diet to soft 10/4 - tolerating well --Ensures TID  Repeat CT scan of the abdomen did not show any complications including abscess, pseudocyst, or vascular compromise.   Community-acquired pneumonia  Chest x-ray showing left basilar infiltrate --Continue ceftriaxone and azithromycin --Symptomatic care per orders, as needed    Acute respiratory failure with hypoxia - due to pneumonia and likely fluid overload --Mgmt of underlying issues as outlined --Supplement O2 to keep sats > 90% & wean as tolerated --Incentive spirometer   Acute metabolic encephalopathy - suspect hospital delirium, and related to pain  meds Daughter reports minimal "lucid" conversation with pt past two days. 10/4--5 - mentation much improved and at baseline --Minimize opiates and any sedating meds --VBG ruled out hypercapnia --Ammonia level normal --Delirium precautions   Hypokalemia - recurrent due to diuresis. K 3.8 Continue repletion and monitoring     Hypophosphatemia - phos 1.6 on 10/3 was replaced with IV K-phos ordered for replacement --Monitor & replace phos PRN   Hyponatremia -- initially felt to be dehydration, but pt had been on aggressive fluids since admission for pancreatitis and sodium trended down.   Net fluid balance was + almost 4 L to date as of 10/2 Na improved with diuresis and improving PO intake tolerance: Na 126 >> 129 >> 131 today --Avoid IV fluids --Stop IV Lasix 40 mg BID --Daily weights to assess volume status --Monitor BMP   Increased endometrial stripe thickness Noted on CT abdomen pelvis with contrast  Pelvic ultrasound showed thickened endometrium   Follow-up with gynecologist as an outpatient    Anxiety PRN Xanax ordered    Essential hypertension Patient takes lisinopril 10 mg daily, not resumed on admission due to greater than or equal to 1% chance of causing pancreatitis Hydralazine IV PRN Monitor blood pressure closely    Morbid obesity (HCC) Body mass index is 40.49 kg/m. Complicates overall care and prognosis.  Recommend lifestyle modifications including physical activity and diet for weight loss and overall long-term health.     DVT prophylaxis: Heparin 5000 units subcutaneous every 8 hours    Code Status: Full code     Family Communication: husband at bedside on rounds this AM    Disposition Plan: home in 24-48 hours if PO intake and  clinical improvement continues.  Consults Gastroenterology       Physical Exam: General exam: awake & alert, no acute distress, mildly confused Respiratory system: on room air, normal respiratory effort. Lungs  clear no wheezes or rhonchi Cardiovascular system: RRR, no edema, normal S1/S2 Gastrointestinal system: abdomen much softer and non-tender, NG tube in place to suction. Central nervous system: A&O x 4 non-focal exam, normal speech Extremities: no peripheral edema, normal tone Skin: dry, intact, normal temperature Psychiatry: normal mood & affect, judgment and insight appear intact      Data Reviewed: I have reviewed patient's lab results as shown below as well as repeat CT scan of the abdomen that did not show any findings of complications of acute pancreatitis, I have also reviewed nursing documentation    Latest Ref Rng & Units 03/10/2023    5:50 AM 03/09/2023    6:07 AM 03/08/2023    4:40 AM  CBC  WBC 4.0 - 10.5 K/uL 10.4  13.5  11.6   Hemoglobin 12.0 - 15.0 g/dL 16.1  09.6  04.5   Hematocrit 36.0 - 46.0 % 33.9  35.0  34.0   Platelets 150 - 400 K/uL 201  196  169        Latest Ref Rng & Units 03/10/2023    5:50 AM 03/09/2023    6:07 AM 03/08/2023    4:40 AM  BMP  Glucose 70 - 99 mg/dL 409  811  914   BUN 8 - 23 mg/dL 11  9  7    Creatinine 0.44 - 1.00 mg/dL 7.82  9.56  2.13   Sodium 135 - 145 mmol/L 131  129  126   Potassium 3.5 - 5.1 mmol/L 3.8  3.3  2.9   Chloride 98 - 111 mmol/L 92  87  86   CO2 22 - 32 mmol/L 28  31  31    Calcium 8.9 - 10.3 mg/dL 8.1  8.2  7.8     Vitals:   03/10/23 0349 03/10/23 0500 03/10/23 0806 03/10/23 1033  BP: 130/72  (!) 141/63   Pulse: 89  90 93  Resp: 16  16   Temp: 98.1 F (36.7 C)  98.7 F (37.1 C)   TempSrc: Oral  Oral   SpO2: 96%  98% 94%  Weight:  95 kg    Height:         Author: Pennie Banter, DO 03/10/2023 2:45 PM  For on call review [email or web address].

## 2023-03-11 DIAGNOSIS — K8500 Idiopathic acute pancreatitis without necrosis or infection: Secondary | ICD-10-CM | POA: Diagnosis not present

## 2023-03-11 DIAGNOSIS — E871 Hypo-osmolality and hyponatremia: Secondary | ICD-10-CM | POA: Diagnosis not present

## 2023-03-11 LAB — BASIC METABOLIC PANEL
Anion gap: 11 (ref 5–15)
BUN: 11 mg/dL (ref 8–23)
CO2: 30 mmol/L (ref 22–32)
Calcium: 8.5 mg/dL — ABNORMAL LOW (ref 8.9–10.3)
Chloride: 91 mmol/L — ABNORMAL LOW (ref 98–111)
Creatinine, Ser: 0.58 mg/dL (ref 0.44–1.00)
GFR, Estimated: >60 mL/min (ref >60)
Glucose, Bld: 98 mg/dL (ref 70–99)
Potassium: 3.7 mmol/L (ref 3.5–5.1)
Sodium: 132 mmol/L — ABNORMAL LOW (ref 135–145)

## 2023-03-11 MED ORDER — SENNOSIDES-DOCUSATE SODIUM 8.6-50 MG PO TABS: 1.0000 | ORAL_TABLET | Freq: Two times a day (BID) | ORAL | Status: DC

## 2023-03-11 MED ORDER — POLYETHYLENE GLYCOL 3350 17 G PO PACK: 17.0000 g | PACK | Freq: Every day | ORAL | 0 refills | Status: DC

## 2023-03-11 MED ORDER — PANTOPRAZOLE SODIUM 40 MG PO TBEC: 40.0000 mg | DELAYED_RELEASE_TABLET | Freq: Every day | ORAL | 1 refills | Status: DC

## 2023-03-11 MED ORDER — OXYCODONE HCL 5 MG PO TABS: 5.0000 mg | ORAL_TABLET | ORAL | 0 refills | Status: DC | PRN

## 2023-03-11 MED ORDER — ONDANSETRON 8 MG PO TBDP: 8.0000 mg | ORAL_TABLET | Freq: Three times a day (TID) | ORAL | 0 refills | Status: DC | PRN

## 2023-03-11 MED ORDER — ACETAMINOPHEN 325 MG PO TABS: 650.0000 mg | ORAL_TABLET | Freq: Four times a day (QID) | ORAL | Status: DC | PRN

## 2023-03-11 MED ORDER — ENSURE MAX PROTEIN PO LIQD: 11.0000 [oz_av] | Freq: Three times a day (TID) | ORAL | Status: DC

## 2023-03-11 MED ORDER — ACETAMINOPHEN 325 MG PO TABS
650.0000 mg | ORAL_TABLET | Freq: Four times a day (QID) | ORAL | Status: DC | PRN
  Administered 2023-03-11: 650 mg via ORAL
  Filled 2023-03-11: qty 2

## 2023-03-11 NOTE — Discharge Summary (Signed)
Physician Discharge Summary   Patient: Vanessa Rhodes MRN: [id number] DOB: 10/27/1960  Admit date:     03/02/2023  Discharge date: 03/11/2023  Discharge Physician: Pennie Banter   PCP: Melida Quitter, PA   Recommendations at discharge:    Follow up with Primary Care in 1 week Repeat BMP, Mg, CBC at follow up and as needed Follow up with Gynecology regarding finding of endometrial thickening seen on CT Follow up as needed with Gastroenterology  Discharge Diagnoses: Principal Problem:   Acute pancreatitis Active Problems:   Hyperlipidemia LDL goal <100   Essential hypertension   Anxiety   Morbid obesity (HCC)   Increased endometrial stripe thickness   Hyponatremia  Resolved Problems:   Hypokalemia   Hypophosphatemia  Hospital Course:  From HPI "Vanessa Rhodes is a 62 year old female with history of hyperlipidemia, hypertension, hypertriglyceridemia, morbid obesity, who presents emergency department for chief concerns of abdominal pain and nausea. Patient has significant lipase elevation and CT scan of the abdomen consistent with acute pancreatitis. Patient therefore admitted for management of acute pancreatitis.   Hospital course was complicated by prolonged abdominal pain and minimal PO intake tolerance, recurrent electrolyte derangements, acute respiratory failure with hypoxia attributed to possible pneumonia and fluid overload requiring IV diuresis.  Further hospital course and management as outlined below.   10/7 -- pt feels relatively well this AM.  Pain improving, controlled on oral medications.  Improving tolerance for meals.  Pt is clinically improved, medically stable and requesting discharge home today    Assessment and Plan:  Acute pancreatitis & duodenitis Significant lipase elevation on admission 5842 -- normalized to 23 (10/3) CT scan of the abdomen concerning for acute pancreatitis with no abscesses or pseudocyst formation --Off IV fluids --PO  intake improving - continue diet as tolerated --Continue as needed pain medication - minimize opiates as much as possible.  --Avoid NSAIDs with duodenitis --Treated with IV Protonix BID, d/c on oral Protonix daily --Advanced diet to soft 10/4 - tolerating well --Ensures TID --Follow up with GI as needed   Repeat CT scan of the abdomen did not show any complications including abscess, pseudocyst, or vascular compromise.     Community-acquired pneumonia  Chest x-ray showing left basilar infiltrate --Completed course of IV ceftriaxone and azithromycin --No longer having any respiratory symptoms     Acute respiratory failure with hypoxia - due to pneumonia and likely fluid overload --Mgmt of underlying issues as outlined --Supplement O2 to keep sats > 90% & wean as tolerated --Incentive spirometer     Acute metabolic encephalopathy - suspect hospital delirium, and related to pain meds Daughter reports minimal "lucid" conversation with pt past two days. 10/4--5 - mentation much improved and at baseline --Minimize opiates and any sedating meds --VBG ruled out hypercapnia --Ammonia level normal --Delirium precautions     Hypokalemia - recurrent due to diuresis. K 3.8 >> 3.7 today Repeat BMP in 1 week     Hypophosphatemia - phos 1.6 on 10/3 was replaced with IV K-phos ordered for replacement --Monitor & replace phos PRN     Hyponatremia -- initially felt to be dehydration, but pt had been on aggressive fluids since admission for pancreatitis and sodium trended down.   Net fluid balance was + almost 4 L to date as of 10/2 Na improved with diuresis and improving PO intake tolerance: Na 126 >> 129 >> 131 .Marland Kitchen 132 today --Avoid IV fluids --Treated with IV Lasix 40 mg BID --PO intake improving  --Daily  weights to monitor volume status --Repeat BMP in 1 week     Increased endometrial stripe thickness Noted on CT abdomen pelvis with contrast  Pelvic ultrasound showed thickened  endometrium   Follow-up with gynecologist as an outpatient     Anxiety PRN Xanax ordered     Essential hypertension Resume lisinopril at d/c.  Held during admission.     Morbid obesity (HCC) Body mass index is 40.49 kg/m. Complicates overall care and prognosis.  Recommend lifestyle modifications including physical activity and diet for weight loss and overall long-term health.          Consultants: Gastroenterology Procedures performed: None  Disposition: Home Diet recommendation:  Discharge Diet Orders (From admission, onward)     Start     Ordered   03/11/23 0000  Diet - low sodium heart healthy        03/11/23 0943            DISCHARGE MEDICATION: Allergies as of 03/11/2023   No Known Allergies      Medication List     TAKE these medications    acetaminophen 325 MG tablet Commonly known as: TYLENOL Take 2 tablets (650 mg total) by mouth every 6 (six) hours as needed for mild pain, headache or fever.   Ensure Max Protein Liqd Take 330 mLs (11 oz total) by mouth with breakfast, with lunch, and with evening meal.   lisinopril 10 MG tablet Commonly known as: ZESTRIL Take 1 tablet by mouth once daily   ondansetron 8 MG disintegrating tablet Commonly known as: ZOFRAN-ODT Take 1 tablet (8 mg total) by mouth every 8 (eight) hours as needed for nausea or vomiting.   oxyCODONE 5 MG immediate release tablet Commonly known as: Oxy IR/ROXICODONE Take 1-2 tablets (5-10 mg total) by mouth every 4 (four) hours as needed for moderate pain or severe pain.   pantoprazole 40 MG tablet Commonly known as: Protonix Take 1 tablet (40 mg total) by mouth daily.   polyethylene glycol 17 g packet Commonly known as: MIRALAX / GLYCOLAX Take 17 g by mouth daily.   senna-docusate 8.6-50 MG tablet Commonly known as: Senokot-S Take 1 tablet by mouth 2 (two) times daily.        Follow-up Information     Saralyn Pilar A, PA Follow up.   Specialty: Family  Medicine Why: Hospital follow up Contact information: [address] [place] [address] [phone]                Discharge Exam: Filed Weights   03/08/23 0500 03/10/23 0500 03/11/23 0500  Weight: 102.9 kg 95 kg 95 kg   General exam: awake, alert, no acute distress HEENT: atraumatic, clear conjunctiva, anicteric sclera, moist mucus membranes, hearing grossly normal  Respiratory system: CTAB, no wheezes, rales or rhonchi, normal respiratory effort. Cardiovascular system: normal S1/S2, RRR, no JVD, murmurs, rubs, gallops, no pedal edema.   Gastrointestinal system: soft, NT, ND, no HSM felt, +bowel sounds. Central nervous system: A&O x 4. no gross focal neurologic deficits, normal speech Extremities: moves all, no edema, normal tone Skin: dry, intact, normal temperature, normal color, No rashes, lesions or ulcers Psychiatry: normal mood, congruent affect, judgement and insight appear normal   Condition at discharge: stable  The results of significant diagnostics from this hospitalization (including imaging, microbiology, ancillary and laboratory) are listed below for reference.   Imaging Studies: DG Chest Port 1 View  Result Date: 03/07/2023 CLINICAL DATA:  Shortness of breath EXAM: PORTABLE  CHEST 1 VIEW COMPARISON:  03/05/2023 FINDINGS: The heart size and mediastinal contours are within normal limits. Both lungs are clear. The visualized skeletal structures are unremarkable. IMPRESSION: No active disease. Electronically Signed   By: Deatra Robinson M.D.   On: 03/07/2023 02:30   CT ABDOMEN PELVIS W CONTRAST  Result Date: 03/05/2023 CLINICAL DATA:  62 year old female with abdominal pain EXAM: CT ABDOMEN AND PELVIS WITH CONTRAST TECHNIQUE: Multidetector CT imaging of the abdomen and pelvis was performed using the standard protocol following bolus administration of intravenous contrast. RADIATION DOSE REDUCTION: This exam was performed according to the departmental  dose-optimization program which includes automated exposure control, adjustment of the mA and/or kV according to patient size and/or use of iterative reconstruction technique. CONTRAST:  OMNIPAQUE IOHEXOL 300 MG/ML  SOLN COMPARISON:  03/02/2023, 02/14/2023 FINDINGS: Lower chest: Small bilateral pleural effusions with associated atelectasis. Hepatobiliary: Unremarkable appearance of the liver. No radiopaque stones within the gallbladder. No inflammatory changes related to the gallbladder. Unremarkable course of the cystic duct. Pancreas: Pancreas tissue enhances uniformly and within normal limits. There are persisting inflammatory changes with edema in the fat adjacent to the pancreas, particularly at the pancreas head/uncinate process. The inflammatory changes extend circumferentially around the duodenum in the 2/3 portion of the duodenum. Superior mesenteric vein patent. Splenic vein patent. No pancreatic necrosis identified. No walled-off fluid collection or pseudocyst formation. There is free fluid adjacent to the pancreas extending distally to the margin of the stomach and to the hilum of the spleen. Spleen: Unremarkable spleen. Adrenals/Urinary Tract: - Right adrenal gland:  Unremarkable - Left adrenal gland: Unremarkable. - Right kidney: No hydronephrosis, nephrolithiasis, inflammation, or ureteral dilation. No focal lesion. - Left Kidney: No hydronephrosis, nephrolithiasis, inflammation, or ureteral dilation. No focal lesion. - Urinary Bladder: Urinary bladder decompressed Stomach/Bowel: - Stomach: Unremarkable. - Small bowel: Inflammatory changes surround the second and third portion of the duodenum. The wall of the duodenum enhances within normal limits. No evidence of small-bowel obstruction. - Appendix: Normal. - Colon: Unremarkable. Vascular/Lymphatic: Mild atherosclerotic changes of the abdominal aorta. No aneurysm. No dissection. Mesenteric arteries and renal arteries patent. Bilateral iliac  arteries patent. Proximal femoral arteries patent. Reproductive: Redemonstration of thickening of the endometrium at the uterine fundus. Unremarkable adnexa. Other: Edema of the abdominal wall and mesentery. Musculoskeletal: No displaced fracture. Degenerative changes of the visualized spine. Degenerative changes of the hips. IMPRESSION: Acute pancreatitis and duodenitis, associated inflammatory changes/reactive free fluid, with no complicating features such as necrosis, vascular compromise, abscess. Small bilateral pleural effusions. Redemonstration of thickening of the endometrium at the uterine fundus, with neoplasm not excluded. Again, non emergent referral for OBGYN evaluation is recommended. Aortic Atherosclerosis (ICD10-I70.0). Electronically Signed   By: Gilmer Mor D.O.   On: 03/05/2023 13:26   DG Chest Port 1 View  Result Date: 03/05/2023 CLINICAL DATA:  Shortness of breath. Difficulty taking a deep breath. EXAM: PORTABLE CHEST 1 VIEW COMPARISON:  None Available. FINDINGS: Examination is degraded due to patient body habitus and portable technique. Borderline enlarged cardiac silhouette, likely accentuated due to decreased lung volumes. Normal mediastinal contours. Minimal left basilar heterogeneous opacities. No definite pleural effusion or pneumothorax. No evidence of edema. No acute osseous abnormalities. IMPRESSION: Left basilar heterogeneous opacities, atelectasis versus infiltrate. Further evaluation with a PA and lateral chest radiograph may be obtained as clinically indicated. Electronically Signed   By: Simonne Come M.D.   On: 03/05/2023 12:15   US PELVIS (TRANSABDOMINAL ONLY)  Result Date: 03/03/2023 CLINICAL DATA:  Abnormal  and heterogeneous endometrial thickening seen on CT. EXAM: TRANSABDOMINAL ULTRASOUND OF PELVIS TECHNIQUE: Transabdominal ultrasound examination of the pelvis was performed including evaluation of the uterus, ovaries, adnexal regions, and pelvic cul-de-sac. COMPARISON:   CT abdomen pelvis dated 02/22/2023. FINDINGS: Evaluation is limited due to overlying bowel gas. Uterus Measurements: 7.3 x 3.4 x 4.9 cm = volume: 63 mL. The uterus is anteverted and grossly unremarkable Endometrium Thickness: 18 mm. The endometrium is thickened. Findings may represent endometrial polyp, hyperplasia, or neoplasm. Further evaluation with hysteroscopy is recommended. Right ovary Not visualized. Left ovary Not visualized. Other findings:  No abnormal free fluid. IMPRESSION: Thickened endometrium. Further evaluation with hysteroscopy is recommended. Electronically Signed   By: Elgie Collard M.D.   On: 03/03/2023 16:23   CT ABDOMEN PELVIS W CONTRAST  Result Date: 03/02/2023 CLINICAL DATA:  Abdominal pain, acute, nonlocalized. History of pancreatitis. Abdominal pain and nausea this morning. EXAM: CT ABDOMEN AND PELVIS WITH CONTRAST TECHNIQUE: Multidetector CT imaging of the abdomen and pelvis was performed using the standard protocol following bolus administration of intravenous contrast. RADIATION DOSE REDUCTION: This exam was performed according to the departmental dose-optimization program which includes automated exposure control, adjustment of the mA and/or kV according to patient size and/or use of iterative reconstruction technique. CONTRAST:  OMNIPAQUE IOHEXOL 300 MG/ML  SOLN COMPARISON:  Abdominopelvic CT 02/14/2023. No other comparison studies. FINDINGS: Lower chest: Mildly increased dependent atelectasis at both lung bases. No significant pleural or pericardial effusion. Hepatobiliary: The liver is normal in density without suspicious focal abnormality. No evidence of gallstones, gallbladder wall thickening or biliary dilatation. Pancreas: Worsening changes of acute pancreatitis with increasing peripancreatic inflammation and ill-defined fluid tracking around the duodenum and left anterior pararenal space. No evidence of pancreatic hemorrhage, necrosis or biliary ductal dilatation.  No walled-off fluid collections are identified. Spleen: Normal in size without focal abnormality. Adrenals/Urinary Tract: Both adrenal glands appear normal. No evidence of urinary tract calculus, suspicious renal lesion or hydronephrosis. The bladder appears unremarkable for its degree of distention. Stomach/Bowel: No enteric contrast administered. The stomach appears unremarkable for its degree of distension. No evidence of bowel wall thickening, distention or surrounding inflammatory change. The appendix is not clearly visualized, although there is no pericecal inflammation to suggest appendicitis. Vascular/Lymphatic: There are no enlarged abdominal or pelvic lymph nodes. Aortic and branch vessel atherosclerosis without evidence of aneurysm or large vessel occlusion. The portal, superior mesenteric and splenic veins remain patent. Reproductive: Abnormal heterogeneous thickening of the endometrium to 1.4 cm for age. The uterus otherwise appears unremarkable. No adnexal mass. Other: No evidence of abdominal wall mass or hernia. No ascites or pneumoperitoneum. Musculoskeletal: No acute or significant osseous findings. Mild multilevel spondylosis. Unless specific follow-up recommendations are mentioned in the findings or impression sections, no imaging follow-up of any mentioned incidental findings is recommended. IMPRESSION: 1. Worsening changes of acute pancreatitis with increasing peripancreatic inflammation and enlarging acute peripancreatic fluid collections. No evidence of pancreatic hemorrhage, necrosis or walled-off fluid collection. 2. No biliary dilatation. 3. Abnormal heterogeneous thickening of the endometrium for age to 1.4 cm, potentially neoplastic. Recommend further evaluation with nonemergent pelvic ultrasound. 4.  Aortic Atherosclerosis (ICD10-I70.0). Electronically Signed   By: Carey Bullocks M.D.   On: 03/02/2023 13:28    Microbiology: Results for orders placed or performed during the hospital  encounter of 04/14/19  Novel Coronavirus, NAA ([facility])     Status: None   Collection Time: 04/14/19  3:21 PM   Specimen: Nasopharyngeal Swab; Nasopharyngeal(NP) swabs in vial transport  medium   NASOPHARYNGE  Result Value Ref Range Status   SARS-CoV-2, NAA Not Detected Not Detected Final    Comment: This nucleic acid amplification test was developed and its performance characteristics determined by [employer]. Nucleic acid amplification tests include PCR and TMA. This test has not been FDA cleared or approved. This test has been authorized by FDA under an Emergency Use Authorization (EUA). This test is only authorized for the duration of time the declaration that circumstances exist justifying the authorization of the emergency use of in vitro diagnostic tests for detection of SARS-CoV-2 virus and/or diagnosis of COVID-19 infection under section 564(b)(1) of the Act, 21 U.S.C. 829FAO-1(H) (1), unless the authorization is terminated or revoked sooner. When diagnostic testing is negative, the possibility of a false negative result should be considered in the context of a patient's recent exposures and the presence of clinical signs and symptoms consistent with COVID-19. An individual without symptoms of COVID-19 and who is not shedding SARS-CoV-2 virus would  expect to have a negative (not detected) result in this assay.     Labs: CBC: No results for input(s): "WBC", "NEUTROABS", "HGB", "HCT", "MCV", "PLT" in the last 168 hours.  Basic Metabolic Panel: No results for input(s): "NA", "K", "CL", "CO2", "GLUCOSE", "BUN", "CREATININE", "CALCIUM", "MG", "PHOS" in the last 168 hours.  Liver Function Tests: No results for input(s): "AST", "ALT", "ALKPHOS", "BILITOT", "PROT", "ALBUMIN" in the last 168 hours.  CBG: No results for input(s): "GLUCAP" in the last 168 hours.  Discharge time spent: less than 30 minutes.  Signed: Pennie Banter, DO [facility] 03/20/2023

## 2023-03-11 NOTE — Plan of Care (Signed)

## 2023-03-11 NOTE — Progress Notes (Signed)
Mobility Specialist - Progress Note   03/11/23 0847  Mobility  Activity Ambulated independently in hallway;Stood at bedside;Dangled on edge of bed  Level of Assistance Independent  Assistive Device None  Distance Ambulated (ft) 160 ft  Activity Response Tolerated well  Mobility Referral Yes  $Mobility charge 1 Mobility  Mobility Specialist Start Time (ACUTE ONLY) [id number]  Mobility Specialist Stop Time (ACUTE ONLY) 0831  Mobility Specialist Time Calculation (min) (ACUTE ONLY) 9 min   Pt supine in bed on RA upon arrival. Pt completes bed mobility, STS, and ambulates in hallway indep with no LOB noted. Spo2 stays above 92 throughout ambulation. Pt returns to bed with needs in reach.   Terrilyn Saver  Mobility Specialist  03/11/23 8:48 AM

## 2023-03-12 ENCOUNTER — Telehealth: Payer: Self-pay

## 2023-03-12 ENCOUNTER — Telehealth (HOSPITAL_COMMUNITY): Payer: Self-pay | Admitting: Pharmacy Technician

## 2023-03-12 ENCOUNTER — Other Ambulatory Visit (HOSPITAL_COMMUNITY): Payer: Self-pay

## 2023-03-12 NOTE — Telephone Encounter (Signed)
 Clinical Questions have been submitted

## 2023-03-12 NOTE — Transitions of Care (Post Inpatient/ED Visit) (Signed)
   03/12/2023  Name: Vanessa Rhodes MRN: [id number] DOB: [birth year]  Today's TOC FU Call Status: Today's TOC FU Call Status:: Successful TOC FU Call Completed TOC FU Call Complete Date: 03/12/23 Patient's Name and Date of Birth confirmed.  Transition Care Management Follow-up Telephone Call Date of Discharge: 03/11/23 Discharge Facility: [facility]) Type of Discharge: Inpatient Admission Primary Inpatient Discharge Diagnosis:: abd pain How have you been since you were released from the hospital?: Better Any questions or concerns?: No  Items Reviewed: Did you receive and understand the discharge instructions provided?: Yes Medications obtained,verified, and reconciled?: Yes (Medications Reviewed) Any new allergies since your discharge?: No Dietary orders reviewed?: Yes Do you have support at home?: Yes People in Home: spouse  Medications Reviewed Today: Medications Reviewed Today     Reviewed by Karena Addison, LPN (Licensed Practical Nurse) on 03/12/23 at 1137  Med List Status: <None>   Medication Order Taking? Sig Documenting Provider Last Dose Status Informant  acetaminophen (TYLENOL) 325 MG tablet [id number]  Take 2 tablets (650 mg total) by mouth every 6 (six) hours as needed for mild pain, headache or fever. Pennie Banter, DO  Active   Ensure Max Protein (ENSURE MAX PROTEIN) LIQD [id number]  Take 330 mLs (11 oz total) by mouth with breakfast, with lunch, and with evening meal. Esaw Grandchild A, DO  Active   lisinopril (ZESTRIL) 10 MG tablet [id number] No Take 1 tablet by mouth once daily Melida Quitter, PA 03/01/2023 1830 Active   ondansetron (ZOFRAN-ODT) 8 MG disintegrating tablet [id number]  Take 1 tablet (8 mg total) by mouth every 8 (eight) hours as needed for nausea or vomiting. Esaw Grandchild A, DO  Active   oxyCODONE (OXY IR/ROXICODONE) 5 MG immediate release tablet [id number]  Take 1-2 tablets (5-10 mg total) by mouth every 4 (four)  hours as needed for moderate pain or severe pain. Esaw Grandchild A, DO  Active   pantoprazole (PROTONIX) 40 MG tablet [id number]  Take 1 tablet (40 mg total) by mouth daily. Esaw Grandchild A, DO  Active   polyethylene glycol (MIRALAX / GLYCOLAX) 17 g packet [id number]  Take 17 g by mouth daily. Esaw Grandchild A, DO  Active   senna-docusate (SENOKOT-S) 8.6-50 MG tablet [id number]  Take 1 tablet by mouth 2 (two) times daily. Pennie Banter, DO  Active             Home Care and Equipment/Supplies: Were Home Health Services Ordered?: NA Any new equipment or medical supplies ordered?: NA  Functional Questionnaire: Do you need assistance with bathing/showering or dressing?: No Do you need assistance with meal preparation?: No Do you need assistance with eating?: No Do you have difficulty maintaining continence: No Do you need assistance with getting out of bed/getting out of a chair/moving?: No Do you have difficulty managing or taking your medications?: No  Follow up appointments reviewed: PCP Follow-up appointment confirmed?: No (no avail appts, sent message to staff to schedule) MD Provider Line Number:[phone] Given: No Specialist Hospital Follow-up appointment confirmed?: NA Do you need transportation to your follow-up appointment?: No Do you understand care options if your condition(s) worsen?: Yes-patient verbalized understanding    SIGNATURE Karena Addison, LPN [facility] Nurse Health Advisor Direct Dial [phone]

## 2023-03-12 NOTE — Telephone Encounter (Signed)
Pharmacy Patient Advocate Encounter  Received notification from [facility] that Prior Authorization for oxyCODONE HCl 5MG  tablets  has been DENIED.  Full denial letter will be uploaded to the media tab. See denial reason below. We have denied your request because it is for more than the amount your plan covers (quantity limit). We reviewed the information we had. We have partially approved your request for this drug up to the amount your plan covers 9 tablets per day of oxycodone 5 mg  PA #/Case ID/Reference #: [id number]

## 2023-03-12 NOTE — Telephone Encounter (Signed)
Pharmacy Patient Advocate Encounter   Received notification from CoverMyMeds that prior authorization for oxyCODONE HCl 5MG  tablets is required/requested.   Insurance verification completed.   The patient is insured through [facility] .   Per test claim: PA required; PA started via CoverMyMeds. KEY [id number] . Waiting for clinical questions to populate.

## 2023-03-18 ENCOUNTER — Telehealth: Payer: Self-pay

## 2023-03-18 NOTE — Telephone Encounter (Signed)
The patient has no needs and is not in the at risk stratification score

## 2023-03-20 ENCOUNTER — Ambulatory Visit: Payer: 59 | Admitting: Family Medicine

## 2023-03-20 ENCOUNTER — Encounter: Payer: Self-pay | Admitting: Internal Medicine

## 2023-03-20 ENCOUNTER — Encounter: Payer: Self-pay | Admitting: Family Medicine

## 2023-03-20 VITALS — BP 135/82 | HR 96 | Resp 18 | Ht 64.0 in | Wt 192.0 lb

## 2023-03-20 DIAGNOSIS — K8500 Idiopathic acute pancreatitis without necrosis or infection: Secondary | ICD-10-CM | POA: Diagnosis not present

## 2023-03-20 DIAGNOSIS — I10 Essential (primary) hypertension: Secondary | ICD-10-CM

## 2023-03-20 DIAGNOSIS — D75839 Thrombocytosis, unspecified: Secondary | ICD-10-CM

## 2023-03-20 DIAGNOSIS — R079 Chest pain, unspecified: Secondary | ICD-10-CM

## 2023-03-20 DIAGNOSIS — I447 Left bundle-branch block, unspecified: Secondary | ICD-10-CM

## 2023-03-20 DIAGNOSIS — E871 Hypo-osmolality and hyponatremia: Secondary | ICD-10-CM

## 2023-03-20 NOTE — Assessment & Plan Note (Signed)
Lisinopril was held in the hospital due to greater than or equal to 1% chance of causing pancreatitis.  At follow-up appointment, consider switching from lisinopril to losartan.

## 2023-03-20 NOTE — Patient Instructions (Addendum)
PAIN MEDICATIONS: Acetaminophen (Tylenol) - Immediate-release: 1000 mg (1 g) orally every 6 hours OR 650 mg orally very 4 hours - Maximum Single Dose: 1000 mg - Maximum Dose: 4000 mg per 24 hours  -DO NOT TAKE IF DRINKING ALCOHOL  Take oxycodone before bed, or for breakthrough pain during the day. AVOID TAKING NSAIDS LIKE IBUPROFEN, NAPROXEN, ADVIL, MOTRIN.  Continue with your current diet, and keep drinking water to stay hydrated. As you start to feel better, you can advance as tolerated to bland, solid foods (BRAT diet: bananas, rice, applesauce and toast).  I am also placing a referral to cardiology because some of the changes on your EKG have stuck around since you got home. They should be calling you to get scheduled!

## 2023-03-20 NOTE — Progress Notes (Signed)
Established Patient Office Visit  Subjective   Patient ID: Vanessa Rhodes, female    DOB: 02/17/1961  Age: 62 y.o. MRN: [id number]  Chief Complaint  Patient presents with   Hospitalization Follow-up   Chest Pain    HPI Vanessa Rhodes is a 62 y.o. female presenting today for follow up of hospitalization from 03/02/2023 through 03/11/2023 for acute pancreatitis.  Presented to emergency department on 03/02/2023 with abdominal pain and nausea, workup demonstrated significant lipase elevation and CT scan consistent with acute pancreatitis.  She was admitted for management, hospital course was complicated by prolonged abdominal pain and minimal PO intake tolerance, recurrent electrolyte derangements, acute respiratory failure with hypoxia attributed to possible pneumonia and fluid overload requiring IV diuresis.  CAP and respiratory failure resolved, recommended labs in 1 week to monitor hyponatremia and hypokalemia, pancreatitis gradually improved and discharged with pain medication and soft diet.  She has still been in significant pain since getting home.  She does not take pain medication, either oxycodone or acetaminophen, very often and typically waits until the pain is unbearable before taking any medicine.  She continues a mostly liquid diet, having broth or shakes and water.  She still has a very low appetite.  Outpatient Medications Prior to Visit  Medication Sig   acetaminophen (TYLENOL) 325 MG tablet Take 2 tablets (650 mg total) by mouth every 6 (six) hours as needed for mild pain, headache or fever.   Ensure Max Protein (ENSURE MAX PROTEIN) LIQD Take 330 mLs (11 oz total) by mouth with breakfast, with lunch, and with evening meal.   lisinopril (ZESTRIL) 10 MG tablet Take 1 tablet by mouth once daily   ondansetron (ZOFRAN-ODT) 8 MG disintegrating tablet Take 1 tablet (8 mg total) by mouth every 8 (eight) hours as needed for nausea or vomiting.   oxyCODONE (OXY IR/ROXICODONE) 5 MG  immediate release tablet Take 1-2 tablets (5-10 mg total) by mouth every 4 (four) hours as needed for moderate pain or severe pain.   pantoprazole (PROTONIX) 40 MG tablet Take 1 tablet (40 mg total) by mouth daily.   polyethylene glycol (MIRALAX / GLYCOLAX) 17 g packet Take 17 g by mouth daily.   senna-docusate (SENOKOT-S) 8.6-50 MG tablet Take 1 tablet by mouth 2 (two) times daily.   No facility-administered medications prior to visit.    ROS Negative unless otherwise noted in HPI   Objective:     BP 135/82 (BP Location: Left Arm, Patient Position: Sitting, Cuff Size: Large)   Pulse 96   Resp 18   Ht 5\' 4"  (1.626 m)   Wt 192 lb (87.1 kg)   SpO2 96%   BMI 32.96 kg/m   Physical Exam Constitutional:      General: She is not in acute distress.    Appearance: Normal appearance.  HENT:     Head: Normocephalic and atraumatic.  Cardiovascular:     Rate and Rhythm: Normal rate and regular rhythm.     Heart sounds: No murmur heard.    No friction rub. No gallop.  Pulmonary:     Effort: Pulmonary effort is normal. No tachypnea, accessory muscle usage or respiratory distress.     Breath sounds: Normal breath sounds. No wheezing, rhonchi or rales.  Abdominal:     General: Bowel sounds are normal. There is no abdominal bruit.     Palpations: Abdomen is soft.     Tenderness: There is abdominal tenderness in the right upper quadrant, right lower quadrant and epigastric  area.  Musculoskeletal:     Right lower leg: No edema.     Left lower leg: No edema.  Skin:    General: Skin is warm and dry.  Neurological:     Mental Status: She is alert and oriented to person, place, and time.   EKG:  LBBB, similar to when first noted on 03/07/2023.   Assessment & Plan:  Chest pain, unspecified type -     EKG 12-Lead  Idiopathic acute pancreatitis without infection or necrosis -     CBC with Differential/Platelet; Future -     Comprehensive metabolic panel; Future -     Magnesium;  Future  Hypomagnesemia -     Magnesium; Future  Hyponatremia -     Comprehensive metabolic panel; Future  Essential hypertension Assessment & Plan: Lisinopril was held in the hospital due to greater than or equal to 1% chance of causing pancreatitis.  At follow-up appointment, consider switching from lisinopril to losartan.  Orders: -     CBC with Differential/Platelet; Future -     Comprehensive metabolic panel; Future  LBBB (left bundle branch block) -     Ambulatory referral to Cardiology  Avoid NSAIDs, continue pain meds prn, PO protonix, GI f/u as needed. CMP repeated to evaluate for hypokalemia and hyponatremia, CBC and Mg also repeated per hospital discharge instructions. Increased endometrial stripe thickness incidental finding on CT abdomen pelvis, pelvic ultrasound revealed endometrial thickness of 18 mm.  Gynecology follow-up scheduled with [facility] for 03/28/2023.  Urgent referral to cardiology given new LBBB first noted on 03/07/2023.  Consider medication for hypertriglyceridemia, continue to monitor liver enzymes.  Return in about 2 weeks (around 04/03/2023) for follow-up for pancreatitis.   I spent 60 minutes on the day of the encounter to include pre-visit record review, face-to-face time with the patient, and post visit ordering of tests/coordination of care.  Melida Quitter, PA

## 2023-03-21 ENCOUNTER — Telehealth: Payer: Self-pay | Admitting: *Deleted

## 2023-03-21 LAB — COMPREHENSIVE METABOLIC PANEL
ALT: 20 [IU]/L (ref 0–32)
AST: 28 [IU]/L (ref 0–40)
Albumin: 4.4 g/dL (ref 3.9–4.9)
Alkaline Phosphatase: 76 [IU]/L (ref 44–121)
BUN/Creatinine Ratio: 15 (ref 12–28)
BUN: 10 mg/dL (ref 8–27)
Bilirubin Total: 0.3 mg/dL (ref 0.0–1.2)
CO2: 21 mmol/L (ref 20–29)
Calcium: 10.2 mg/dL (ref 8.7–10.3)
Chloride: 98 mmol/L (ref 96–106)
Creatinine, Ser: 0.65 mg/dL (ref 0.57–1.00)
Globulin, Total: 2.7 g/dL (ref 1.5–4.5)
Glucose: 113 mg/dL — ABNORMAL HIGH (ref 70–99)
Potassium: 4.7 mmol/L (ref 3.5–5.2)
Sodium: 135 mmol/L (ref 134–144)
Total Protein: 7.1 g/dL (ref 6.0–8.5)
eGFR: 98 mL/min/{1.73_m2} (ref >59)

## 2023-03-21 LAB — CBC WITH DIFFERENTIAL/PLATELET
Basophils Absolute: 0.1 10*3/uL (ref 0.0–0.2)
Basos: 1 %
EOS (ABSOLUTE): 0.1 10*3/uL (ref 0.0–0.4)
Eos: 2 %
Hematocrit: 42.9 % (ref 34.0–46.6)
Hemoglobin: 14 g/dL (ref 11.1–15.9)
Immature Grans (Abs): 0 10*3/uL (ref 0.0–0.1)
Immature Granulocytes: 0 %
Lymphocytes Absolute: 1.3 10*3/uL (ref 0.7–3.1)
Lymphs: 21 %
MCH: 29.1 pg (ref 26.6–33.0)
MCHC: 32.6 g/dL (ref 31.5–35.7)
MCV: 89 fL (ref 79–97)
Monocytes Absolute: 0.6 10*3/uL (ref 0.1–0.9)
Monocytes: 9 %
Neutrophils Absolute: 4.3 10*3/uL (ref 1.4–7.0)
Neutrophils: 67 %
Platelets: 564 10*3/uL — ABNORMAL HIGH (ref 150–450)
RBC: 4.81 x10E6/uL (ref 3.77–5.28)
RDW: 12.9 % (ref 11.7–15.4)
WBC: 6.4 10*3/uL (ref 3.4–10.8)

## 2023-03-21 LAB — MAGNESIUM: Magnesium: 2.2 mg/dL (ref 1.6–2.3)

## 2023-03-21 MED ORDER — OXYCODONE HCL 5 MG PO TABS: 5.0000 mg | ORAL_TABLET | ORAL | 0 refills | Status: DC | PRN

## 2023-03-21 NOTE — Telephone Encounter (Signed)
Yes, completely forgot to send prescription.  Oxycodone has now been sent as discussed and requested.  PDMP reviewed, no aberrancies.  Meds ordered this encounter  Medications   oxyCODONE (OXY IR/ROXICODONE) 5 MG immediate release tablet    Sig: Take 1-2 tablets (5-10 mg total) by mouth every 4 (four) hours as needed for moderate pain (pain score 4-6) or severe pain (pain score 7-10).    Dispense:  30 tablet    Refill:  0    Order Specific Question:   Supervising Provider    Answer:   Nani Gasser D [[id number]]

## 2023-03-21 NOTE — Addendum Note (Signed)
Addended by: Saralyn Pilar on: 03/21/2023 10:28 AM   Modules accepted: Orders

## 2023-03-21 NOTE — Telephone Encounter (Signed)
Pt calling to say that provider was supposed to be sending in pain medication per their conversation at the appointment yesterday. She would like the oxycodone to be sent to the [facility] on [address] in [place], [place].

## 2023-03-22 ENCOUNTER — Telehealth: Payer: Self-pay

## 2023-03-22 NOTE — Telephone Encounter (Signed)
Pt informed of below.  

## 2023-03-27 ENCOUNTER — Other Ambulatory Visit: Payer: 59

## 2023-03-27 DIAGNOSIS — D75839 Thrombocytosis, unspecified: Secondary | ICD-10-CM

## 2023-03-28 LAB — CBC
Hematocrit: 42.7 % (ref 34.0–46.6)
Hemoglobin: 13.6 g/dL (ref 11.1–15.9)
MCH: 28.9 pg (ref 26.6–33.0)
MCHC: 31.9 g/dL (ref 31.5–35.7)
MCV: 91 fL (ref 79–97)
Platelets: 342 10*3/uL (ref 150–450)
RBC: 4.7 x10E6/uL (ref 3.77–5.28)
RDW: 13.1 % (ref 11.7–15.4)
WBC: 4.9 10*3/uL (ref 3.4–10.8)

## 2023-03-29 NOTE — Plan of Care (Signed)
 [facility] Tonsillectomy/Adenoidectomy, Postoperative PEDS care plan entered in error.

## 2023-04-10 ENCOUNTER — Telehealth: Payer: Self-pay

## 2023-04-19 ENCOUNTER — Ambulatory Visit (INDEPENDENT_AMBULATORY_CARE_PROVIDER_SITE_OTHER): Payer: 59 | Admitting: Family Medicine

## 2023-04-19 ENCOUNTER — Encounter: Payer: Self-pay | Admitting: Family Medicine

## 2023-04-19 VITALS — BP 119/67 | HR 68 | Resp 18 | Ht 64.0 in | Wt 181.0 lb

## 2023-04-19 DIAGNOSIS — Z23 Encounter for immunization: Secondary | ICD-10-CM

## 2023-04-19 DIAGNOSIS — I10 Essential (primary) hypertension: Secondary | ICD-10-CM

## 2023-04-19 DIAGNOSIS — K8500 Idiopathic acute pancreatitis without necrosis or infection: Secondary | ICD-10-CM | POA: Diagnosis not present

## 2023-04-19 DIAGNOSIS — K219 Gastro-esophageal reflux disease without esophagitis: Secondary | ICD-10-CM | POA: Diagnosis not present

## 2023-04-19 DIAGNOSIS — R9389 Abnormal findings on diagnostic imaging of other specified body structures: Secondary | ICD-10-CM | POA: Diagnosis not present

## 2023-04-19 MED ORDER — PANTOPRAZOLE SODIUM 40 MG PO TBEC
40.0000 mg | DELAYED_RELEASE_TABLET | Freq: Every day | ORAL | 0 refills | Status: DC
Start: 2023-04-19 — End: 2023-12-30

## 2023-04-19 MED ORDER — LISINOPRIL 10 MG PO TABS: 10.0000 mg | ORAL_TABLET | Freq: Every day | ORAL | 0 refills | Status: DC

## 2023-04-19 MED ORDER — FAMOTIDINE 20 MG PO TABS
20.0000 mg | ORAL_TABLET | Freq: Every day | ORAL | 0 refills | Status: DC
Start: 2023-04-19 — End: 2023-05-20

## 2023-04-19 NOTE — Assessment & Plan Note (Signed)
Continue pantoprazole 40 mg daily, continue avoiding known trigger foods and avoiding eating food less than 3 hours before bedtime.  Add famotidine 20 mg at bedtime until next appointment with primary care 07/01/2023.  Also discussed that if famotidine alone is not adequate, may move pantoprazole dose to bedtime as well.

## 2023-04-19 NOTE — Assessment & Plan Note (Signed)
Recovering well.  Continue avoiding NSAIDs but using pain medication as needed.  Continue pantoprazole 40 mg daily.  Discussed that full recovery may take some time, continue staying hydrated and eating as tolerated.

## 2023-04-19 NOTE — Patient Instructions (Signed)
Start taking famotidine at bedtime.  If after about a week this is not effective, you could try also moving your pantoprazole to bedtime.  I will be looking for the results of your ultrasound scheduled for December 4.  You are in very capable hands with Dr. Jean Rosenthal and he will make sure that you get the absolute best care no matter what the outcome of the ultrasound it is :)

## 2023-04-19 NOTE — Assessment & Plan Note (Signed)
Continue with plan for transvaginal ultrasound 05/08/2023 and follow-up with Dr. Jean Rosenthal.

## 2023-04-19 NOTE — Progress Notes (Signed)
Established Patient Office Visit  Subjective   Patient ID: Vanessa Rhodes, female    DOB: 1960-12-31  Age: 62 y.o. MRN: [id number]  Chief Complaint  Patient presents with   Pancreatitis    HPI Vanessa Rhodes is a 62 y.o. female presenting today for follow up of pancreatitis.  At hospital follow-up visit on 03/20/2023, recommended to avoid NSAIDs, continue pain meds prn, PO protonix, GI f/u as needed. CMP repeated to evaluate for hypokalemia and hyponatremia, CBC and Mg also repeated per hospital discharge instructions.  At check, hypokalemia and hyponatremia resolved, magnesium within normal limits, CBC with thrombocytosis which normalized on recheck 03/27/2023.  Patient's only complaint is worsened acid reflux during the night.  Complains of burning sensation in throat and mouth, denies vomiting or abdominal pain.  She continues to take pantoprazole 40 mg daily each morning, avoid triggering foods, and stop eating anything at least 3 hours prior to going to bed.  She has tried Tums with no relief. Increased endometrial stripe thickness incidental finding on CT abdomen pelvis, pelvic ultrasound revealed endometrial thickness of 18 mm.  Gynecology follow-up with Dr. Jean Rosenthal at [facility] on 04/18/2023.  Plan for transvaginal ultrasound on 05/08/2023 to assess for polyps versus endometrial carcinoma.  Outpatient Medications Prior to Visit  Medication Sig   acetaminophen (TYLENOL) 325 MG tablet Take 2 tablets (650 mg total) by mouth every 6 (six) hours as needed for mild pain, headache or fever.   Ensure Max Protein (ENSURE MAX PROTEIN) LIQD Take 330 mLs (11 oz total) by mouth with breakfast, with lunch, and with evening meal.   ondansetron (ZOFRAN-ODT) 8 MG disintegrating tablet Take 1 tablet (8 mg total) by mouth every 8 (eight) hours as needed for nausea or vomiting.   oxyCODONE (OXY IR/ROXICODONE) 5 MG immediate release tablet Take 1-2 tablets (5-10 mg total) by mouth every 4 (four)  hours as needed for moderate pain (pain score 4-6) or severe pain (pain score 7-10).   polyethylene glycol (MIRALAX / GLYCOLAX) 17 g packet Take 17 g by mouth daily.   senna-docusate (SENOKOT-S) 8.6-50 MG tablet Take 1 tablet by mouth 2 (two) times daily.   [DISCONTINUED] lisinopril (ZESTRIL) 10 MG tablet Take 1 tablet by mouth once daily   [DISCONTINUED] pantoprazole (PROTONIX) 40 MG tablet Take 1 tablet (40 mg total) by mouth daily.   No facility-administered medications prior to visit.    ROS Negative unless otherwise noted in HPI   Objective:     BP 119/67 (BP Location: Left Arm, Patient Position: Sitting, Cuff Size: Normal)   Pulse 68   Resp 18   Ht 5\' 4"  (1.626 m)   Wt 181 lb (82.1 kg)   SpO2 98%   BMI 31.07 kg/m   Physical Exam Constitutional:      General: She is not in acute distress.    Appearance: Normal appearance.  HENT:     Head: Normocephalic and atraumatic.     Mouth/Throat:     Mouth: Mucous membranes are moist.     Dentition: No gum lesions.     Tongue: No lesions. Tongue does not deviate from midline.     Pharynx: No oropharyngeal exudate or posterior oropharyngeal erythema.     Comments: No signs of oral thrush Cardiovascular:     Rate and Rhythm: Normal rate and regular rhythm.     Heart sounds: No murmur heard.    No friction rub. No gallop.  Pulmonary:     Effort: Pulmonary effort  is normal. No respiratory distress.     Breath sounds: No wheezing, rhonchi or rales.  Skin:    General: Skin is warm and dry.  Neurological:     Mental Status: She is alert and oriented to person, place, and time.     Assessment & Plan:  Idiopathic acute pancreatitis without infection or necrosis Assessment & Plan: Recovering well.  Continue avoiding NSAIDs but using pain medication as needed.  Continue pantoprazole 40 mg daily.  Discussed that full recovery may take some time, continue staying hydrated and eating as tolerated.   Increased endometrial stripe  thickness Assessment & Plan: Continue with plan for transvaginal ultrasound 05/08/2023 and follow-up with Dr. Jean Rosenthal.   Gastroesophageal reflux disease, unspecified whether esophagitis present Assessment & Plan: Continue pantoprazole 40 mg daily, continue avoiding known trigger foods and avoiding eating food less than 3 hours before bedtime.  Add famotidine 20 mg at bedtime until next appointment with primary care 07/01/2023.  Also discussed that if famotidine alone is not adequate, may move pantoprazole dose to bedtime as well.  Orders: -     Famotidine; Take 1 tablet (20 mg total) by mouth at bedtime.  Dispense: 90 tablet; Refill: 0 -     Pantoprazole Sodium; Take 1 tablet (40 mg total) by mouth daily.  Dispense: 90 tablet; Refill: 0  Primary hypertension -     Lisinopril; Take 1 tablet (10 mg total) by mouth daily.  Dispense: 90 tablet; Refill: 0  Need for influenza vaccination -     Flu vaccine trivalent PF, 6mos and older(Flulaval,Afluria,Fluarix,Fluzone)  Essential hypertension Assessment & Plan: Refill provided of lisinopril 10 mg daily.  In the future, may consider switching from lisinopril to losartan due to greater than or equal to 1% chance of lisinopril causing pancreatitis .   At next appointment, we will discuss recommendations for mammogram and cervical cancer screening as well as shingles vaccines.  Return as scheduled 07/01/2023.    Melida Quitter, PA

## 2023-04-19 NOTE — Assessment & Plan Note (Signed)
Refill provided of lisinopril 10 mg daily.  In the future, may consider switching from lisinopril to losartan due to greater than or equal to 1% chance of lisinopril causing pancreatitis .

## 2023-05-06 NOTE — Progress Notes (Unsigned)
  Cardiology Office Note:  .   Date:  05/06/2023  ID:  Vanessa Rhodes, DOB 01-Aug-1960, MRN [id number] PCP: Vanessa Quitter, PA  [facility] Providers Cardiologist:  None { Click to update primary MD,subspecialty MD or APP then REFRESH:1}   History of Present Illness: .   Vanessa Rhodes is a 62 y.o. female HTN, morbid obesity, pancreatitis in October, 2024, she is being referred to cardiology for LBBB which she had in the hospital, but is being referred now to work this up. She was tachycardic ***  ROS: ***  Studies Reviewed: .        *** Risk Assessment/Calculations:   {Does this patient have ATRIAL FIBRILLATION?:[id number]} No BP recorded.  {Refresh Note OR Click here to enter BP  :1}***       Physical Exam:   VS:  There were no vitals taken for this visit.   Wt Readings from Last 3 Encounters:  04/19/23 181 lb (82.1 kg)  03/20/23 192 lb (87.1 kg)  03/11/23 209 lb 7 oz (95 kg)    GEN: Well nourished, well developed in no acute distress NECK: No JVD; No carotid bruits CARDIAC: ***RRR, no murmurs, rubs, gallops RESPIRATORY:  Clear to auscultation without rales, wheezing or rhonchi  ABDOMEN: Soft, non-tender, non-distended EXTREMITIES:  No edema; No deformity   ASSESSMENT AND PLAN: .   LBBB    {Are you ordering a CV Procedure (e.g. stress test, cath, DCCV, TEE, etc)?   Press F2        :[id number]}  Dispo: ***  Signed, Maisie Fus, MD

## 2023-05-07 ENCOUNTER — Ambulatory Visit: Payer: 59 | Attending: Internal Medicine | Admitting: Internal Medicine

## 2023-05-07 ENCOUNTER — Encounter: Payer: Self-pay | Admitting: Internal Medicine

## 2023-05-07 VITALS — BP 134/80 | HR 76 | Ht 64.0 in | Wt 177.2 lb

## 2023-05-07 DIAGNOSIS — I10 Essential (primary) hypertension: Secondary | ICD-10-CM | POA: Diagnosis not present

## 2023-05-07 NOTE — Patient Instructions (Signed)
Medication Instructions:  No changes at this time  *If you need a refill on your cardiac medications before your next appointment, please call your pharmacy*  Follow-Up: At [facility], you and your health needs are our priority.  As part of our continuing mission to provide you with exceptional heart care, we have created designated Provider Care Teams.  These Care Teams include your primary Cardiologist (physician) and Advanced Practice Providers (APPs -  Physician Assistants and Nurse Practitioners) who all work together to provide you with the care you need, when you need it.    Your next appointment:   Follow up as needed   Provider:   Maisie Fus, MD

## 2023-05-09 ENCOUNTER — Other Ambulatory Visit: Payer: Self-pay | Admitting: Obstetrics and Gynecology

## 2023-05-14 ENCOUNTER — Encounter
Admission: RE | Admit: 2023-05-14 | Discharge: 2023-05-14 | Disposition: A | Discharge status: Home / Self Care | Payer: 59 | Source: Ambulatory Visit | Attending: Obstetrics and Gynecology | Admitting: Obstetrics and Gynecology

## 2023-05-14 ENCOUNTER — Other Ambulatory Visit: Payer: Self-pay

## 2023-05-14 HISTORY — DX: Acute pancreatitis without necrosis or infection, unspecified: K85.90

## 2023-05-14 HISTORY — DX: Nonspecific intraventricular block: I45.4

## 2023-05-14 HISTORY — DX: Tachycardia, unspecified: R00.0

## 2023-05-14 NOTE — Patient Instructions (Addendum)
Your procedure is scheduled on: Monday 05/20/23  Report to the Registration Desk on the 1st floor of the [facility]. To find out your arrival time, please call [phone] between 1PM - 3PM on: Friday 05/17/23 If your arrival time is 6:00 am, do not arrive before that time as the [facility] entrance doors do not open until 6:00 am.  REMEMBER: Instructions that are not followed completely may result in serious medical risk, up to and including death; or upon the discretion of your surgeon and anesthesiologist your surgery may need to be rescheduled.  Do not eat food after midnight the night before surgery.  No gum chewing or hard candies.  You may however, drink CLEAR liquids up to 2 hours before you are scheduled to arrive for your surgery. Do not drink anything within 2 hours of your scheduled arrival time.  Clear liquids include: - water  - apple juice without pulp - gatorade (not RED colors) - black coffee or tea (Do NOT add milk or creamers to the coffee or tea) Do NOT drink anything that is not on this list.   In addition, your doctor has ordered for you to drink the provided:  Ensure Pre-Surgery Clear Carbohydrate Drink  Gatorade G2 Drinking this carbohydrate drink up to two hours before surgery helps to reduce insulin resistance and improve patient outcomes. Please complete drinking 2 hours before scheduled arrival time.  One week prior to surgery: Stop Anti-inflammatories (NSAIDS) such as Advil, Aleve, Ibuprofen, Motrin, Naproxen, Naprosyn and Aspirin based products such as Excedrin, Goody's Powder, BC Powder. Stop ANY OVER THE COUNTER supplements until after surgery.  You may however, continue to take Tylenol if needed for pain up until the day of surgery.   Continue taking all of your other prescription medications up until the day of surgery.  ON THE DAY OF SURGERY ONLY TAKE THESE MEDICATIONS WITH SIPS OF WATER:  pantoprazole (PROTONIX) 40 MG    No Alcohol  for 24 hours before or after surgery.  No Smoking including e-cigarettes for 24 hours before surgery.  No chewable tobacco products for at least 6 hours before surgery.  No nicotine patches on the day of surgery.  Do not use any "recreational" drugs for at least a week (preferably 2 weeks) before your surgery.  Please be advised that the combination of cocaine and anesthesia may have negative outcomes, up to and including death. If you test positive for cocaine, your surgery will be cancelled.  On the morning of surgery brush your teeth with toothpaste and water, you may rinse your mouth with mouthwash if you wish. Do not swallow any toothpaste or mouthwash.  Use CHG Soap or wipes as directed on instruction sheet.  Do not wear jewelry, make-up, hairpins, clips or nail polish.  For welded (permanent) jewelry: bracelets, anklets, waist bands, etc.  Please have this removed prior to surgery.  If it is not removed, there is a chance that hospital personnel will need to cut it off on the day of surgery.  Do not wear lotions, powders, or perfumes.   Do not shave body hair from the neck down 48 hours before surgery.  Contact lenses, hearing aids and dentures may not be worn into surgery.  Do not bring valuables to the hospital. [facility] is not responsible for any missing/lost belongings or valuables.   Bring your C-PAP to the hospital in case you may have to spend the night.   Notify your doctor if there is any  change in your medical condition (cold, fever, infection).  Wear comfortable clothing (specific to your surgery type) to the hospital.  After surgery, you can help prevent lung complications by doing breathing exercises.  Take deep breaths and cough every 1-2 hours. Your doctor may order a device called an Incentive Spirometer to help you take deep breaths. When coughing or sneezing, hold a pillow firmly against your incision with both hands. This is called "splinting." Doing  this helps protect your incision. It also decreases belly discomfort.  If you are being admitted to the hospital overnight, leave your suitcase in the car. After surgery it may be brought to your room.  In case of increased patient census, it may be necessary for you, the patient, to continue your postoperative care in the Same Day Surgery department.  If you are being discharged the day of surgery, you will not be allowed to drive home. You will need a responsible individual to drive you home and stay with you for 24 hours after surgery.   If you are taking public transportation, you will need to have a responsible individual with you.  Please call the Pre-admissions Testing Dept. at [phone] if you have any questions about these instructions.  Surgery Visitation Policy:  Patients having surgery or a procedure may have two visitors.  Children under the age of 75 must have an adult with them who is not the patient.  Inpatient Visitation:    Visiting hours are 7 a.m. to 8 p.m. Up to four visitors are allowed at one time in a patient room. The visitors may rotate out with other people during the day.  One visitor age 4 or older may stay with the patient overnight and must be in the room by 8 p.m.     Preparing for Surgery with CHLORHEXIDINE GLUCONATE (CHG) Soap  Chlorhexidine Gluconate (CHG) Soap  o An antiseptic cleaner that kills germs and bonds with the skin to continue killing germs even after washing  o Used for showering the night before surgery and morning of surgery  Before surgery, you can play an important role by reducing the number of germs on your skin.  CHG (Chlorhexidine gluconate) soap is an antiseptic cleanser which kills germs and bonds with the skin to continue killing germs even after washing.  Please do not use if you have an allergy to CHG or antibacterial soaps. If your skin becomes reddened/irritated stop using the CHG.  1. Shower the NIGHT BEFORE  SURGERY and the MORNING OF SURGERY with CHG soap.  2. If you choose to wash your hair, wash your hair first as usual with your normal shampoo.  3. After shampooing, rinse your hair and body thoroughly to remove the shampoo.  4. Use CHG as you would any other liquid soap. You can apply CHG directly to the skin and wash gently with a scrungie or a clean washcloth.  5. Apply the CHG soap to your body only from the neck down. Do not use on open wounds or open sores. Avoid contact with your eyes, ears, mouth, and genitals (private parts). Wash face and genitals (private parts) with your normal soap.  6. Wash thoroughly, paying special attention to the area where your surgery will be performed.  7. Thoroughly rinse your body with warm water.  8. Do not shower/wash with your normal soap after using and rinsing off the CHG soap.  9. Pat yourself dry with a clean towel.  10. Wear clean pajamas to  bed the night before surgery.  12. Place clean sheets on your bed the night of your first shower and do not sleep with pets.  13. Shower again with the CHG soap on the day of surgery prior to arriving at the hospital.  14. Do not apply any deodorants/lotions/powders.  15. Please wear clean clothes to the hospital.

## 2023-05-20 ENCOUNTER — Encounter: Payer: Self-pay | Admitting: Obstetrics and Gynecology

## 2023-05-20 ENCOUNTER — Other Ambulatory Visit: Payer: Self-pay

## 2023-05-20 ENCOUNTER — Ambulatory Visit: Payer: 59 | Admitting: Urgent Care

## 2023-05-20 ENCOUNTER — Encounter
Admission: RE | Disposition: A | Discharge status: Home / Self Care | Payer: Self-pay | Source: Ambulatory Visit | Attending: Obstetrics and Gynecology

## 2023-05-20 ENCOUNTER — Ambulatory Visit
Admission: RE | Admit: 2023-05-20 | Discharge: 2023-05-20 | Disposition: A | Discharge status: Home / Self Care | Payer: 59 | Source: Ambulatory Visit | Attending: Obstetrics and Gynecology | Admitting: Obstetrics and Gynecology

## 2023-05-20 ENCOUNTER — Ambulatory Visit: Payer: 59 | Admitting: Anesthesiology

## 2023-05-20 DIAGNOSIS — N840 Polyp of corpus uteri: Secondary | ICD-10-CM | POA: Diagnosis not present

## 2023-05-20 DIAGNOSIS — N841 Polyp of cervix uteri: Secondary | ICD-10-CM | POA: Insufficient documentation

## 2023-05-20 DIAGNOSIS — Z87891 Personal history of nicotine dependence: Secondary | ICD-10-CM | POA: Insufficient documentation

## 2023-05-20 DIAGNOSIS — K219 Gastro-esophageal reflux disease without esophagitis: Secondary | ICD-10-CM | POA: Insufficient documentation

## 2023-05-20 DIAGNOSIS — Z79899 Other long term (current) drug therapy: Secondary | ICD-10-CM | POA: Diagnosis not present

## 2023-05-20 DIAGNOSIS — R9389 Abnormal findings on diagnostic imaging of other specified body structures: Secondary | ICD-10-CM | POA: Diagnosis not present

## 2023-05-20 DIAGNOSIS — Z8719 Personal history of other diseases of the digestive system: Secondary | ICD-10-CM | POA: Insufficient documentation

## 2023-05-20 DIAGNOSIS — N9489 Other specified conditions associated with female genital organs and menstrual cycle: Secondary | ICD-10-CM | POA: Diagnosis present

## 2023-05-20 DIAGNOSIS — I10 Essential (primary) hypertension: Secondary | ICD-10-CM | POA: Insufficient documentation

## 2023-05-20 DIAGNOSIS — N858 Other specified noninflammatory disorders of uterus: Secondary | ICD-10-CM | POA: Diagnosis present

## 2023-05-20 HISTORY — PX: DILATATION & CURETTAGE/HYSTEROSCOPY WITH MYOSURE: SHX6511

## 2023-05-20 SURGERY — DILATATION & CURETTAGE/HYSTEROSCOPY WITH MYOSURE
Anesthesia: General | Site: Uterus

## 2023-05-20 MED ORDER — LIDOCAINE HCL (CARDIAC) PF 100 MG/5ML IV SOSY
PREFILLED_SYRINGE | INTRAVENOUS | Status: DC | PRN
  Administered 2023-05-20: 100 mg via INTRAVENOUS

## 2023-05-20 MED ORDER — SENNOSIDES-DOCUSATE SODIUM 8.6-50 MG PO TABS: 1.0000 | ORAL_TABLET | Freq: Every evening | ORAL | Status: DC | PRN

## 2023-05-20 MED ORDER — CHLORHEXIDINE GLUCONATE 0.12 % MT SOLN
OROMUCOSAL | Status: AC
  Filled 2023-05-20: qty 15

## 2023-05-20 MED ORDER — FENTANYL CITRATE (PF) 100 MCG/2ML IJ SOLN
25.0000 ug | INTRAMUSCULAR | Status: DC | PRN
  Administered 2023-05-20 (×2): 50 ug via INTRAVENOUS

## 2023-05-20 MED ORDER — DEXAMETHASONE SODIUM PHOSPHATE 10 MG/ML IJ SOLN
INTRAMUSCULAR | Status: AC
  Filled 2023-05-20: qty 1

## 2023-05-20 MED ORDER — IBUPROFEN 600 MG PO TABS: 600.0000 mg | ORAL_TABLET | Freq: Four times a day (QID) | ORAL | 0 refills | Status: DC

## 2023-05-20 MED ORDER — GLYCOPYRROLATE 0.2 MG/ML IJ SOLN
INTRAMUSCULAR | Status: DC | PRN
  Administered 2023-05-20: .2 mg via INTRAVENOUS

## 2023-05-20 MED ORDER — EPHEDRINE 5 MG/ML INJ
INTRAVENOUS | Status: AC
  Filled 2023-05-20: qty 5

## 2023-05-20 MED ORDER — MIDAZOLAM HCL 2 MG/2ML IJ SOLN
INTRAMUSCULAR | Status: DC | PRN
  Administered 2023-05-20: 2 mg via INTRAVENOUS

## 2023-05-20 MED ORDER — GLYCOPYRROLATE 0.2 MG/ML IJ SOLN
INTRAMUSCULAR | Status: AC
  Filled 2023-05-20: qty 1

## 2023-05-20 MED ORDER — EPHEDRINE SULFATE-NACL 50-0.9 MG/10ML-% IV SOSY
PREFILLED_SYRINGE | INTRAVENOUS | Status: DC | PRN
  Administered 2023-05-20: 10 mg via INTRAVENOUS

## 2023-05-20 MED ORDER — FENTANYL CITRATE (PF) 100 MCG/2ML IJ SOLN
INTRAMUSCULAR | Status: AC
  Filled 2023-05-20: qty 2

## 2023-05-20 MED ORDER — CHLORHEXIDINE GLUCONATE 0.12 % MT SOLN
15.0000 mL | Freq: Once | OROMUCOSAL | Status: AC
  Administered 2023-05-20: 15 mL via OROMUCOSAL

## 2023-05-20 MED ORDER — FENTANYL CITRATE (PF) 100 MCG/2ML IJ SOLN
INTRAMUSCULAR | Status: DC | PRN
  Administered 2023-05-20: 25 ug via INTRAVENOUS

## 2023-05-20 MED ORDER — LIDOCAINE HCL (PF) 2 % IJ SOLN
INTRAMUSCULAR | Status: AC
  Filled 2023-05-20: qty 5

## 2023-05-20 MED ORDER — ACETAMINOPHEN 10 MG/ML IV SOLN
INTRAVENOUS | Status: DC | PRN
  Administered 2023-05-20: 1000 mg via INTRAVENOUS

## 2023-05-20 MED ORDER — SILVER NITRATE-POT NITRATE 75-25 % EX MISC
CUTANEOUS | Status: DC | PRN
  Administered 2023-05-20: 2

## 2023-05-20 MED ORDER — OXYCODONE HCL 5 MG PO TABS
5.0000 mg | ORAL_TABLET | Freq: Once | ORAL | Status: AC | PRN
Start: 2023-05-20 — End: 2023-05-20
  Administered 2023-05-20: 5 mg via ORAL

## 2023-05-20 MED ORDER — LACTATED RINGERS IV SOLN: INTRAVENOUS | Status: DC

## 2023-05-20 MED ORDER — ONDANSETRON HCL 4 MG/2ML IJ SOLN
INTRAMUSCULAR | Status: DC | PRN
  Administered 2023-05-20: 4 mg via INTRAVENOUS

## 2023-05-20 MED ORDER — MIDAZOLAM HCL 2 MG/2ML IJ SOLN
INTRAMUSCULAR | Status: AC
  Filled 2023-05-20: qty 2

## 2023-05-20 MED ORDER — HYDROCODONE-ACETAMINOPHEN 5-325 MG PO TABS: 1.0000 | ORAL_TABLET | Freq: Four times a day (QID) | ORAL | 0 refills | Status: DC | PRN

## 2023-05-20 MED ORDER — ONDANSETRON HCL 4 MG/2ML IJ SOLN
INTRAMUSCULAR | Status: AC
  Filled 2023-05-20: qty 2

## 2023-05-20 MED ORDER — PHENYLEPHRINE 80 MCG/ML (10ML) SYRINGE FOR IV PUSH (FOR BLOOD PRESSURE SUPPORT)
PREFILLED_SYRINGE | INTRAVENOUS | Status: DC | PRN
  Administered 2023-05-20 (×2): 160 ug via INTRAVENOUS

## 2023-05-20 MED ORDER — DEXAMETHASONE SODIUM PHOSPHATE 10 MG/ML IJ SOLN
INTRAMUSCULAR | Status: DC | PRN
  Administered 2023-05-20: 10 mg via INTRAVENOUS

## 2023-05-20 MED ORDER — ACETAMINOPHEN 10 MG/ML IV SOLN
INTRAVENOUS | Status: AC
  Filled 2023-05-20: qty 100

## 2023-05-20 MED ORDER — OXYCODONE HCL 5 MG PO TABS
ORAL_TABLET | ORAL | Status: AC
  Filled 2023-05-20: qty 1

## 2023-05-20 MED ORDER — PROPOFOL 10 MG/ML IV BOLUS
INTRAVENOUS | Status: AC
  Filled 2023-05-20: qty 20

## 2023-05-20 MED ORDER — OXYCODONE HCL 5 MG/5ML PO SOLN: 5.0000 mg | Freq: Once | ORAL | Status: AC | PRN

## 2023-05-20 MED ORDER — SODIUM CHLORIDE 0.9 % IR SOLN
Status: DC | PRN
  Administered 2023-05-20: 3000 mL

## 2023-05-20 MED ORDER — PROPOFOL 10 MG/ML IV BOLUS
INTRAVENOUS | Status: DC | PRN
  Administered 2023-05-20: 150 mg via INTRAVENOUS

## 2023-05-20 MED ORDER — ORAL CARE MOUTH RINSE: 15.0000 mL | Freq: Once | OROMUCOSAL | Status: AC

## 2023-05-20 MED ORDER — ENSURE MAX PROTEIN PO LIQD: 11.0000 [oz_av] | ORAL | Status: DC

## 2023-05-20 SURGICAL SUPPLY — 24 items
BAG URINE DRAIN 2000ML AR STRL (UROLOGICAL SUPPLIES) IMPLANT
CATH URTH 16FR FL 2W BLN LF (CATHETERS) IMPLANT
DEVICE MYOSURE LITE (MISCELLANEOUS) IMPLANT
DEVICE MYOSURE REACH (MISCELLANEOUS) IMPLANT
DRSG TELFA 3X8 NADH STRL (GAUZE/BANDAGES/DRESSINGS) IMPLANT
ELECT REM PT RETURN 9FT ADLT (ELECTROSURGICAL) ×1
ELECTRODE REM PT RTRN 9FT ADLT (ELECTROSURGICAL) ×1 IMPLANT
GLOVE BIO SURGEON STRL SZ7 (GLOVE) ×1 IMPLANT
GLOVE BIOGEL PI IND STRL 7.5 (GLOVE) ×1 IMPLANT
GOWN STRL REUS W/ TWL LRG LVL3 (GOWN DISPOSABLE) ×2 IMPLANT
IV NS IRRIG 3000ML ARTHROMATIC (IV SOLUTION) ×1 IMPLANT
KIT PROCEDURE FLUENT (KITS) ×1 IMPLANT
KIT TURNOVER CYSTO (KITS) ×1 IMPLANT
MANIFOLD NEPTUNE II (INSTRUMENTS) ×1 IMPLANT
PACK DNC HYST (MISCELLANEOUS) ×1 IMPLANT
PAD OB MATERNITY 4.3X12.25 (PERSONAL CARE ITEMS) ×1 IMPLANT
PAD PREP OB/GYN DISP 24X41 (PERSONAL CARE ITEMS) ×1 IMPLANT
SCRUB CHG 4% DYNA-HEX 4OZ (MISCELLANEOUS) ×1 IMPLANT
SEAL ROD LENS SCOPE MYOSURE (ABLATOR) ×1 IMPLANT
SET CYSTO W/LG BORE CLAMP LF (SET/KITS/TRAYS/PACK) IMPLANT
SURGILUBE 2OZ TUBE FLIPTOP (MISCELLANEOUS) ×1 IMPLANT
TRAP FLUID SMOKE EVACUATOR (MISCELLANEOUS) ×1 IMPLANT
TUBING CONNECTING 10 (TUBING) ×1 IMPLANT
WATER STERILE IRR 500ML POUR (IV SOLUTION) ×1 IMPLANT

## 2023-05-20 NOTE — Anesthesia Preprocedure Evaluation (Signed)
Anesthesia Evaluation  Patient identified by MRN, date of birth, ID band Patient awake    Reviewed: Allergy & Precautions, NPO status , Patient's Chart, lab work & pertinent test results  History of Anesthesia Complications Negative for: history of anesthetic complications  Airway Mallampati: III  TM Distance: >3 FB Neck ROM: full    Dental no notable dental hx.    Pulmonary neg pulmonary ROS, former smoker   Pulmonary exam normal        Cardiovascular hypertension, On Medications Normal cardiovascular exam     Neuro/Psych  PSYCHIATRIC DISORDERS Anxiety     negative neurological ROS     GI/Hepatic Neg liver ROS,GERD  Medicated,,  Endo/Other  negative endocrine ROS    Renal/GU negative Renal ROS  negative genitourinary   Musculoskeletal   Abdominal   Peds  Hematology negative hematology ROS (+)   Anesthesia Other Findings Past Medical History: 03/02/2023: Hypokalemia 03/07/2023: Hypophosphatemia No date: Pancreatitis No date: Rate-related bundle branch block (LBBB) No date: Tachycardia  Past Surgical History: 2007: KNEE SURGERY; Left     Comment:  Meniscus tear 1979: TUBAL LIGATION  BMI    Body Mass Index: 30.04 kg/m      Reproductive/Obstetrics negative OB ROS                             Anesthesia Physical Anesthesia Plan  ASA: 2  Anesthesia Plan: General   Post-op Pain Management: Toradol IV (intra-op)* and Ofirmev IV (intra-op)*   Induction: Intravenous  PONV Risk Score and Plan: 3 and Dexamethasone, Ondansetron, Treatment may vary due to age or medical condition and Midazolam  Airway Management Planned: LMA  Additional Equipment:   Intra-op Plan:   Post-operative Plan: Extubation in OR  Informed Consent: I have reviewed the patients History and Physical, chart, labs and discussed the procedure including the risks, benefits and alternatives for the proposed  anesthesia with the patient or authorized representative who has indicated his/her understanding and acceptance.     Dental Advisory Given  Plan Discussed with: Anesthesiologist, CRNA and Surgeon  Anesthesia Plan Comments: (Patient consented for risks of anesthesia including but not limited to:  - adverse reactions to medications - damage to eyes, teeth, lips or other oral mucosa - nerve damage due to positioning  - sore throat or hoarseness - Damage to heart, brain, nerves, lungs, other parts of body or loss of life  Patient voiced understanding and assent.)       Anesthesia Quick Evaluation

## 2023-05-20 NOTE — Anesthesia Postprocedure Evaluation (Signed)
Anesthesia Post Note  Patient: Vanessa Rhodes  Procedure(s) Performed: DILATATION & CURETTAGE/HYSTEROSCOPY WITH MYOSURE (Uterus)  Patient location during evaluation: PACU Anesthesia Type: General Level of consciousness: awake and alert Pain management: pain level controlled Vital Signs Assessment: post-procedure vital signs reviewed and stable Respiratory status: spontaneous breathing, nonlabored ventilation, respiratory function stable and patient connected to nasal cannula oxygen Cardiovascular status: blood pressure returned to baseline and stable Postop Assessment: no apparent nausea or vomiting Anesthetic complications: no   No notable events documented.   Last Vitals:  Vitals:   05/20/23 0900 05/20/23 0908  BP: 98/61 (!) 93/56  Pulse: 60 (!) 58  Resp: 11 17  Temp:  (!) 36.1 C  SpO2: 94% 93%    Last Pain:  Vitals:   05/20/23 0908  TempSrc:   PainSc: 3                  Louie Boston

## 2023-05-20 NOTE — Op Note (Signed)
Operative Note   Name: Vanessa Rhodes  Date of Service: 05/20/2023  DOB: Oct 29, 1960  MRN: [id number]   PRE-OP DIAGNOSIS:  1) Thickened endometrium 2) Endometrial mass   POST-OP DIAGNOSIS:  1) Thickened endometrium 2) Endometrial mass  3) Cervical polyp  SURGEON: Surgeons and Role:    Conard Novak, MD - Primary  PROCEDURE: Procedure(s): 1) Hysteroscopy, dilation and  curettage 2) Removal of uterine polyp(s)/mass 3) Cervical polypectomy  ANESTHESIA: General   ESTIMATED BLOOD LOSS: 5 mL  DRAINS: none   TOTAL IV FLUIDS: 500 mL crystalloid  SPECIMENS:  1) Cervical polyp 2) Endometrial polyp(s) 3) Endocervical curettings  VTE PROPHYLAXIS: SCDs to the bilateral lower extremities  ANTIBIOTICS: none indicated, none given  FLUID DEFICIT: 255 mL  COMPLICATIONS: none  DISPOSITION: PACU - hemodynamically stable.  CONDITION: stable  INDICATION: 62 y.o. female who is menopausal who was incidentally noted to have endometrial thickening on CT scan.  Ultrasound measured this to be about 15 mm with blood flow within parts of the mass.  She was counseled about management options and decision was made for sampling and removal.   FINDINGS: Exam under anesthesia revealed small, mobile anteverted uterus with no masses and bilateral adnexa without masses or fullness. And on speculum exam an approximately 1 cm, flesh-colored polypoid lesion was noted at the external cervical os.  Hysteroscopy revealed an otherwise normal appearing uterine cavity with bilateral tubal ostia and normal appearing endocervical canal. There was a broad-based polypoid lesion coming off the right latera and anterior uterine wall.    PROCEDURE IN DETAIL:  After informed consent was obtained, the patient was taken to the operating room where anesthesia was obtained without difficulty. The patient was positioned in the dorsal lithotomy position in Grabill stirrups.  The patient's bladder was catheterized with an  in and out foley catheter.  The patient was examined under anesthesia, with the above noted findings.  The bi-valved speculum was placed inside the patient's vagina, and the the anterior lip of the cervix was grasped with the tenaculum.  The cervical polyp was identified and grasped with a ring forceps.  Axial rotation was performed and the polyp detached and hemostasis was noted. Then the cervix was progressively dilated to a 7 mm Hegar dilator.  The hysteroscope was introduced, with the above noted findings. The MyoSure Reach device was attached to the hysteroscope and the polyp was removed in the usual fashion. Hemostasis was obtained by progressively lowering the pressure to that of well below the MAP.    The hystersocope was removed and the uterine cavity was curetted until a gritty texture was noted, yielding small endometrial curettings.  The area with the polypoid lesion was avoided.  The cervix was monitored for five minutes to makes sure that hemostasis was still present within the uterine cavity.  Excellent hemostasis was noted on the external cervix, and all instruments were removed, ensuring that no instruments remained.  She was then taken out of dorsal lithotomy. The patient tolerated the procedure well.  Sponge, lap and needle counts were correct x2.  The patient was taken to recovery room in excellent condition.  Conard Novak, MD, FACOG 05/20/2023 8:24 AM

## 2023-05-20 NOTE — Transfer of Care (Signed)
Immediate Anesthesia Transfer of Care Note  Patient: Vanessa Rhodes  Procedure(s) Performed: Procedure(s): DILATATION & CURETTAGE/HYSTEROSCOPY WITH MYOSURE (N/A)  Patient Location: PACU  Anesthesia Type:General  Level of Consciousness: sedated  Airway & Oxygen Therapy: Patient Spontanous Breathing and Patient connected to face mask oxygen  Post-op Assessment: Report given to RN and Post -op Vital signs reviewed and stable  Post vital signs: Reviewed and stable  Last Vitals:  Vitals:   05/20/23 0630 05/20/23 0833  BP: (!) 144/83 112/64  Pulse: 68 69  Resp: 18 16  Temp: 36.4 C 36.8 C  SpO2: 99% 100%    Complications: No apparent anesthesia complications

## 2023-05-20 NOTE — Anesthesia Procedure Notes (Signed)
Procedure Name: LMA Insertion Date/Time: 05/20/2023 7:40 AM  Performed by: Stormy Fabian, CRNAPre-anesthesia Checklist: Patient identified, Patient being monitored, Timeout performed, Emergency Drugs available and Suction available Patient Re-evaluated:Patient Re-evaluated prior to induction Oxygen Delivery Method: Circle system utilized Preoxygenation: Pre-oxygenation with 100% oxygen Induction Type: IV induction Ventilation: Mask ventilation without difficulty LMA: LMA inserted LMA Size: 3.0 Tube type: Oral Number of attempts: 1 Placement Confirmation: positive ETCO2 and breath sounds checked- equal and bilateral Tube secured with: Tape Dental Injury: Teeth and Oropharynx as per pre-operative assessment

## 2023-05-20 NOTE — H&P (Signed)
Preoperative History and Physical  Vanessa Rhodes is a 62 y.o. No obstetric history on file. here for surgical management of thickened endometrium and endometrial mass.   No significant preoperative concerns.  History of Present Illness: 62 y.o. G2P0 female who presents in follow up from a recent hospitalization where she was noted incidentally on CT scan to have a thickened endometrial stripe of 18 mm. She denies vaginal bleeding. She was in the hospital for pancreatitis and she has felt better that last 3-4 days. She went through menopause about 10 years ago. She has had no bleeding since that time.   The questions I usually use to raise concern for cancer (weight loss, bloating, etc) are obscured by her recent pancreatitis.   Her last pap smear was a long time ago and was normal.   Transvaginal ultrasound on 05/08/2023: Ultrasound demonstrates the following findings Adnexa: no masses seen  Uterus: anteverted with endometrial stripe 15.1 mm Additional: within the uterus there appears to be a lesion that measures about 15.1 mm, is vascular, and appears to be either a polyp or a fibroid.   Proposed surgery: Hysteroscopy, dilation and curettage, removal of endometrial mass  Past Medical History:  Diagnosis Date   Hypokalemia 03/02/2023   Hypophosphatemia 03/07/2023   Pancreatitis    Rate-related bundle branch block (LBBB)    Tachycardia    Past Surgical History:  Procedure Laterality Date   KNEE SURGERY Left 2007   Meniscus tear   TUBAL LIGATION  1979   OB History  No obstetric history on file.  Patient denies any other pertinent gynecologic issues.   No current facility-administered medications on file prior to encounter.   Current Outpatient Medications on File Prior to Encounter  Medication Sig Dispense Refill   acetaminophen (TYLENOL) 325 MG tablet Take 2 tablets (650 mg total) by mouth every 6 (six) hours as needed for mild pain, headache or fever.     Ensure Max  Protein (ENSURE MAX PROTEIN) LIQD Take 330 mLs (11 oz total) by mouth with breakfast, with lunch, and with evening meal. (Patient taking differently: Take 11 oz by mouth 2 (two) times a week.)     lisinopril (ZESTRIL) 10 MG tablet Take 1 tablet (10 mg total) by mouth daily. 90 tablet 0   pantoprazole (PROTONIX) 40 MG tablet Take 1 tablet (40 mg total) by mouth daily. 90 tablet 0   senna-docusate (SENOKOT-S) 8.6-50 MG tablet Take 1 tablet by mouth 2 (two) times daily. (Patient taking differently: Take 1 tablet by mouth at bedtime as needed for moderate constipation.)     famotidine (PEPCID) 20 MG tablet Take 1 tablet (20 mg total) by mouth at bedtime. (Patient not taking: Reported on 05/09/2023) 90 tablet 0   ondansetron (ZOFRAN-ODT) 8 MG disintegrating tablet Take 1 tablet (8 mg total) by mouth every 8 (eight) hours as needed for nausea or vomiting. (Patient not taking: Reported on 05/20/2023) 20 tablet 0   oxyCODONE (OXY IR/ROXICODONE) 5 MG immediate release tablet Take 1-2 tablets (5-10 mg total) by mouth every 4 (four) hours as needed for moderate pain (pain score 4-6) or severe pain (pain score 7-10). (Patient not taking: Reported on 05/07/2023) 30 tablet 0   polyethylene glycol (MIRALAX / GLYCOLAX) 17 g packet Take 17 g by mouth daily. (Patient not taking: Reported on 05/09/2023) 30 each 0   No Known Allergies  Social History:   reports that she quit smoking about 18 years ago. Her smoking use included cigarettes. She has  been exposed to tobacco smoke. She has quit using smokeless tobacco. She reports current alcohol use. She reports that she does not use drugs.  Family History  Problem Relation Age of Onset   Pulmonary disease Father     Review of Systems: Noncontributory  PHYSICAL EXAM: Blood pressure (!) 144/83, pulse 68, temperature 97.6 F (36.4 C), temperature source Tympanic, resp. rate 18, height 5\' 4"  (1.626 m), weight 79.4 kg, SpO2 99%. CONSTITUTIONAL: Well-developed,  well-nourished female in no acute distress.  HENT:  Normocephalic, atraumatic, External right and left ear normal. Oropharynx is clear and moist EYES: Conjunctivae and EOM are normal. Pupils are equal, round, and reactive to light. No scleral icterus.  NECK: Normal range of motion, supple, no masses SKIN: Skin is warm and dry. No rash noted. Not diaphoretic. No erythema. No pallor. NEUROLGIC: Alert and oriented to person, place, and time. Normal reflexes, muscle tone coordination. No cranial nerve deficit noted. PSYCHIATRIC: Normal mood and affect. Normal behavior. Normal judgment and thought content. CARDIOVASCULAR: Normal heart rate noted, regular rhythm RESPIRATORY: Effort and breath sounds normal, no problems with respiration noted ABDOMEN: Soft, nontender, nondistended. PELVIC: Deferred MUSCULOSKELETAL: Normal range of motion. No edema and no tenderness. 2+ distal pulses.  Labs: No results found for this or any previous visit (from the past 2 weeks).  Imaging Studies: No results found.  Assessment: Thickened endometrium Endometrial mass  Plan: Patient will undergo surgical management with the above-noted surgery.   The risks of surgery were discussed in detail with the patient including but not limited to: bleeding which may require transfusion or reoperation; infection which may require antibiotics; injury to surrounding organs which may involve bowel, bladder, ureters ; need for additional procedures including laparoscopy or laparotomy; thromboembolic phenomenon, surgical site problems and other postoperative/anesthesia complications. Likelihood of success in alleviating the patient's condition was discussed. Routine postoperative instructions will be reviewed with the patient and her family in detail after surgery.  The patient concurred with the proposed plan, giving informed written consent for the surgery.  Patient has been NPO since last night she will remain NPO for procedure.   Anesthesia and OR aware.  Preoperative prophylactic antibiotics, as indicated, and SCDs ordered on call to the OR.  To OR when ready.  Thomasene Mohair, MD 05/20/2023 7:23 AM

## 2023-05-21 LAB — SURGICAL PATHOLOGY

## 2023-06-18 ENCOUNTER — Other Ambulatory Visit: Payer: Self-pay

## 2023-06-18 DIAGNOSIS — E785 Hyperlipidemia, unspecified: Secondary | ICD-10-CM

## 2023-06-18 DIAGNOSIS — Z0000 Encounter for general adult medical examination without abnormal findings: Secondary | ICD-10-CM

## 2023-06-18 DIAGNOSIS — K8500 Idiopathic acute pancreatitis without necrosis or infection: Secondary | ICD-10-CM

## 2023-06-18 DIAGNOSIS — I10 Essential (primary) hypertension: Secondary | ICD-10-CM

## 2023-06-18 DIAGNOSIS — E871 Hypo-osmolality and hyponatremia: Secondary | ICD-10-CM

## 2023-06-24 ENCOUNTER — Other Ambulatory Visit: Payer: 59

## 2023-06-24 DIAGNOSIS — Z0000 Encounter for general adult medical examination without abnormal findings: Secondary | ICD-10-CM

## 2023-06-24 DIAGNOSIS — I10 Essential (primary) hypertension: Secondary | ICD-10-CM

## 2023-06-24 DIAGNOSIS — K8500 Idiopathic acute pancreatitis without necrosis or infection: Secondary | ICD-10-CM

## 2023-06-24 DIAGNOSIS — E871 Hypo-osmolality and hyponatremia: Secondary | ICD-10-CM

## 2023-06-24 DIAGNOSIS — E785 Hyperlipidemia, unspecified: Secondary | ICD-10-CM

## 2023-06-25 ENCOUNTER — Encounter: Payer: Self-pay | Admitting: Family Medicine

## 2023-06-25 LAB — COMPREHENSIVE METABOLIC PANEL
ALT: 13 [IU]/L (ref 0–32)
AST: 20 [IU]/L (ref 0–40)
Albumin: 4.5 g/dL (ref 3.9–4.9)
Alkaline Phosphatase: 86 [IU]/L (ref 44–121)
BUN/Creatinine Ratio: 17 (ref 12–28)
BUN: 12 mg/dL (ref 8–27)
Bilirubin Total: 0.5 mg/dL (ref 0.0–1.2)
CO2: 24 mmol/L (ref 20–29)
Calcium: 10.3 mg/dL (ref 8.7–10.3)
Chloride: 102 mmol/L (ref 96–106)
Creatinine, Ser: 0.71 mg/dL (ref 0.57–1.00)
Globulin, Total: 2.2 g/dL (ref 1.5–4.5)
Glucose: 87 mg/dL (ref 70–99)
Potassium: 4 mmol/L (ref 3.5–5.2)
Sodium: 142 mmol/L (ref 134–144)
Total Protein: 6.7 g/dL (ref 6.0–8.5)
eGFR: 95 mL/min/{1.73_m2} (ref >59)

## 2023-06-25 LAB — CBC WITH DIFFERENTIAL/PLATELET
Basophils Absolute: 0 10*3/uL (ref 0.0–0.2)
Basos: 1 %
EOS (ABSOLUTE): 0.2 10*3/uL (ref 0.0–0.4)
Eos: 3 %
Hematocrit: 45.5 % (ref 34.0–46.6)
Hemoglobin: 15.4 g/dL (ref 11.1–15.9)
Immature Grans (Abs): 0 10*3/uL (ref 0.0–0.1)
Immature Granulocytes: 0 %
Lymphocytes Absolute: 2 10*3/uL (ref 0.7–3.1)
Lymphs: 38 %
MCH: 30.1 pg (ref 26.6–33.0)
MCHC: 33.8 g/dL (ref 31.5–35.7)
MCV: 89 fL (ref 79–97)
Monocytes Absolute: 0.3 10*3/uL (ref 0.1–0.9)
Monocytes: 6 %
Neutrophils Absolute: 2.8 10*3/uL (ref 1.4–7.0)
Neutrophils: 52 %
Platelets: 216 10*3/uL (ref 150–450)
RBC: 5.12 x10E6/uL (ref 3.77–5.28)
RDW: 13.5 % (ref 11.7–15.4)
WBC: 5.3 10*3/uL (ref 3.4–10.8)

## 2023-06-25 LAB — LIPID PANEL
Chol/HDL Ratio: 3.9 {ratio} (ref 0.0–4.4)
Cholesterol, Total: 189 mg/dL (ref 100–199)
HDL: 49 mg/dL (ref >39)
LDL Chol Calc (NIH): 113 mg/dL — ABNORMAL HIGH (ref 0–99)
Triglycerides: 153 mg/dL — ABNORMAL HIGH (ref 0–149)
VLDL Cholesterol Cal: 27 mg/dL (ref 5–40)

## 2023-06-25 LAB — HEMOGLOBIN A1C
Est. average glucose Bld gHb Est-mCnc: 114 mg/dL
Hgb A1c MFr Bld: 5.6 % (ref 4.8–5.6)

## 2023-06-25 LAB — TSH: TSH: 1.58 u[IU]/mL (ref 0.450–4.500)

## 2023-07-01 ENCOUNTER — Encounter: Payer: Self-pay | Admitting: Family Medicine

## 2023-07-01 ENCOUNTER — Ambulatory Visit (INDEPENDENT_AMBULATORY_CARE_PROVIDER_SITE_OTHER): Payer: 59 | Admitting: Family Medicine

## 2023-07-01 VITALS — BP 110/65 | HR 59 | Temp 97.6°F | Ht 64.0 in | Wt 170.0 lb

## 2023-07-01 DIAGNOSIS — Z1231 Encounter for screening mammogram for malignant neoplasm of breast: Secondary | ICD-10-CM | POA: Diagnosis not present

## 2023-07-01 DIAGNOSIS — I10 Essential (primary) hypertension: Secondary | ICD-10-CM

## 2023-07-01 DIAGNOSIS — Z0000 Encounter for general adult medical examination without abnormal findings: Secondary | ICD-10-CM | POA: Diagnosis not present

## 2023-07-01 DIAGNOSIS — E785 Hyperlipidemia, unspecified: Secondary | ICD-10-CM | POA: Diagnosis not present

## 2023-07-01 MED ORDER — LISINOPRIL 10 MG PO TABS: 10.0000 mg | ORAL_TABLET | Freq: Every day | ORAL | 1 refills | Status: DC

## 2023-07-01 NOTE — Patient Instructions (Signed)
Keep up the AMAZING work!!!  I have included some additional information as you are thinking about the shingles vaccine.  If you decide that you are interested in getting it, we do have them at our office or you can get it at the pharmacy.

## 2023-07-01 NOTE — Assessment & Plan Note (Signed)
Stable, at goal. Continue lisinopril 10 mg daily.  In the future, may consider switching from lisinopril to losartan due to greater than or equal to 1% chance of lisinopril causing pancreatitis .

## 2023-07-01 NOTE — Progress Notes (Signed)
Complete physical exam  Patient: Vanessa Rhodes   DOB: 10-Jul-1960   63 y.o. Female  MRN: [id number]  Subjective:    Chief Complaint  Patient presents with   Annual Exam    Vanessa Rhodes is a 63 y.o. female who presents today for a complete physical exam. She reports consuming a low fat diet.  She walks almost every day as long as the weather cooperates with her, and she also exercises in addition to walking.  She generally feels well.  She has really been working on changing her nutrition and physical activity to bring her cholesterol levels down and prevent getting pancreatitis again.  She does not have additional problems to discuss today.    Most recent fall risk assessment:    07/01/2023    8:18 AM  Fall Risk   Falls in the past year? 0  Number falls in past yr: 0  Risk for fall due to : No Fall Risks  Follow up Falls evaluation completed     Most recent depression and anxiety screenings:    07/01/2023    8:18 AM 02/18/2023   10:46 AM  PHQ 2/9 Scores  PHQ - 2 Score 0 0  PHQ- 9 Score  3      12/28/2022    8:56 AM 07/02/2022    8:16 AM 03/01/2022    8:47 AM 11/29/2021    1:26 PM  GAD 7 : Generalized Anxiety Score  Nervous, Anxious, on Edge 0 1 1 1   Control/stop worrying 0 1 1 1   Worry too much - different things 0 1 1 1   Trouble relaxing 0 1 1 0  Restless 0 1 1 0  Easily annoyed or irritable 0 0 0 0  Afraid - awful might happen 0 1 1 1   Total GAD 7 Score 0 6 6 4   Anxiety Difficulty Not difficult at all  Not difficult at all Not difficult at all    Patient Active Problem List   Diagnosis Date Noted   Thickened endometrium 05/20/2023   Endometrial mass 05/20/2023   Gastroesophageal reflux disease 04/19/2023   Morbid obesity (HCC) 03/02/2023   Increased endometrial stripe thickness 03/02/2023   Elevated ALT measurement 02/18/2023   BMI 36.0-36.9,adult 07/02/2022   Essential hypertension 11/07/2021   Anxiety 11/07/2021   Lichen planus 05/09/2016    Hyperlipidemia LDL goal <100 12/30/2015   Arthralgia of temporomandibular joint 12/30/2015    Past Surgical History:  Procedure Laterality Date   DILATATION & CURETTAGE/HYSTEROSCOPY WITH MYOSURE N/A 05/20/2023   Procedure: DILATATION & CURETTAGE/HYSTEROSCOPY WITH CERVICAL POLYPECTOMY AND REMOVAL OF ENDOMETRIAL MASS;  Surgeon: Conard Novak, MD;  Location: [facility] ORS;  Service: Gynecology;  Laterality: N/A;   KNEE SURGERY Left 2007   Meniscus tear   TUBAL LIGATION  1979   Social History   Tobacco Use   Smoking status: Former    Current packs/day: 0.00    Types: Cigarettes    Quit date: 06/04/2004    Years since quitting: 19.0    Passive exposure: Current   Smokeless tobacco: Former  Building services engineer status: Never Used  Substance Use Topics   Alcohol use: Yes    Comment: occasional    Drug use: No   Family History  Problem Relation Age of Onset   Pulmonary disease Father    No Known Allergies   Patient Care Team: Melida Quitter, PA as PCP - General (Family Medicine) Maisie Fus, MD as PCP -  Cardiology (Cardiology)   Outpatient Medications Prior to Visit  Medication Sig   pantoprazole (PROTONIX) 40 MG tablet Take 1 tablet (40 mg total) by mouth daily.   [DISCONTINUED] Ensure Max Protein (ENSURE MAX PROTEIN) LIQD Take 330 mLs (11 oz total) by mouth 2 (two) times a week.   [DISCONTINUED] HYDROcodone-acetaminophen (NORCO/VICODIN) 5-325 MG tablet Take 1 tablet by mouth every 6 (six) hours as needed (breakthrough pain).   [DISCONTINUED] ibuprofen (ADVIL) 600 MG tablet Take 1 tablet (600 mg total) by mouth every 6 (six) hours.   [DISCONTINUED] lisinopril (ZESTRIL) 10 MG tablet Take 1 tablet (10 mg total) by mouth daily.   [DISCONTINUED] senna-docusate (SENOKOT-S) 8.6-50 MG tablet Take 1 tablet by mouth at bedtime as needed for moderate constipation.   No facility-administered medications prior to visit.    Review of Systems  Constitutional:  Negative for chills,  fever and malaise/fatigue.  HENT:  Negative for congestion and hearing loss.   Eyes:  Negative for blurred vision and double vision.  Respiratory:  Negative for cough and shortness of breath.   Cardiovascular:  Negative for chest pain, palpitations and leg swelling.  Gastrointestinal:  Negative for abdominal pain, constipation, diarrhea and heartburn.  Genitourinary:  Negative for frequency and urgency.  Musculoskeletal:  Negative for myalgias and neck pain.  Neurological:  Negative for headaches.  Endo/Heme/Allergies:  Negative for polydipsia.  Psychiatric/Behavioral:  Negative for depression. The patient is not nervous/anxious.       Objective:    BP 110/65   Pulse (!) 59   Temp 97.6 F (36.4 C) (Temporal)   Ht 5\' 4"  (1.626 m)   Wt 170 lb (77.1 kg)   SpO2 97%   BMI 29.18 kg/m    Physical Exam Constitutional:      General: She is not in acute distress.    Appearance: Normal appearance.  HENT:     Head: Normocephalic and atraumatic.     Right Ear: Tympanic membrane, ear canal and external ear normal.     Left Ear: Tympanic membrane, ear canal and external ear normal.     Nose: Nose normal.     Mouth/Throat:     Mouth: Mucous membranes are moist.     Pharynx: No oropharyngeal exudate or posterior oropharyngeal erythema.  Eyes:     Extraocular Movements: Extraocular movements intact.     Conjunctiva/sclera: Conjunctivae normal.     Pupils: Pupils are equal, round, and reactive to light.  Neck:     Thyroid: No thyroid mass, thyromegaly or thyroid tenderness.  Cardiovascular:     Rate and Rhythm: Normal rate and regular rhythm.     Heart sounds: Normal heart sounds. No murmur heard.    No friction rub. No gallop.  Pulmonary:     Effort: Pulmonary effort is normal. No respiratory distress.     Breath sounds: Normal breath sounds. No wheezing, rhonchi or rales.  Abdominal:     General: Abdomen is flat. Bowel sounds are normal. There is no distension.     Palpations:  There is no mass.     Tenderness: There is no abdominal tenderness. There is no guarding.  Musculoskeletal:        General: Normal range of motion.     Cervical back: Normal range of motion and neck supple.  Lymphadenopathy:     Cervical: No cervical adenopathy.  Skin:    General: Skin is warm and dry.  Neurological:     Mental Status: She is alert and oriented to  person, place, and time.     Cranial Nerves: No cranial nerve deficit.     Motor: No weakness.     Deep Tendon Reflexes: Reflexes normal.  Psychiatric:        Mood and Affect: Mood normal.        Assessment & Plan:    Routine Health Maintenance and Physical Exam  Immunization History  Administered Date(s) Administered   Influenza, Seasonal, Injecte, Preservative Fre 04/19/2023   Influenza,inj,Quad PF,6+ Mos 02/27/2016, 07/02/2022   Tdap 12/30/2015    Health Maintenance  Topic Date Due   Cervical Cancer Screening (HPV/Pap Cotest)  Never done   MAMMOGRAM  Never done   Zoster Vaccines- Shingrix (1 of 2) Never done   COVID-19 Vaccine (1 - 2024-25 season) 07/17/2023 (Originally 02/03/2023)   DTaP/Tdap/Td (2 - Td or Tdap) 12/29/2025   Fecal DNA (Cologuard)  01/01/2026   INFLUENZA VACCINE  Completed   Hepatitis C Screening  Completed   HIV Screening  Completed   HPV VACCINES  Aged Out    Reviewed most recent labs including CBC, CMP, lipid panel, A1C, TSH, and vitamin D. All within normal limits/stable from last check other than LDL 113, triglycerides 153 but improved from 262. Patient agreeable to mammogram being ordered today.  She is still considering the shingles vaccine, we discussed recommendations for this vaccine. Pap smear is going to be completed around July with Dr. Jean Rosenthal at [facility].  Discussed health benefits of physical activity, and encouraged her to engage in regular exercise appropriate for her age and condition.  Wellness examination  Hyperlipidemia LDL goal <100 Assessment &  Plan: Last lipid panel: LDL 113, HDL 49, triglycerides 153.  LDL and triglycerides have both improved since last check.  Continue limiting trans and saturated fats and getting routine physical activity.  Will monitor every 6 months to reevaluate need for additional intervention.   Screening mammogram for breast cancer -     3D Screening Mammogram, Left and Right; Future  Essential hypertension Assessment & Plan: Stable, at goal. Continue lisinopril 10 mg daily.  In the future, may consider switching from lisinopril to losartan due to greater than or equal to 1% chance of lisinopril causing pancreatitis .  Orders: -     Lisinopril; Take 1 tablet (10 mg total) by mouth daily.  Dispense: 90 tablet; Refill: 1    Return in about 6 months (around 12/29/2023) for follow-up for HTN, HLD, fasting labs 1 week before.     Melida Quitter, PA

## 2023-07-01 NOTE — Assessment & Plan Note (Signed)
Last lipid panel: LDL 113, HDL 49, triglycerides 153.  LDL and triglycerides have both improved since last check.  Continue limiting trans and saturated fats and getting routine physical activity.  Will monitor every 6 months to reevaluate need for additional intervention.

## 2023-07-11 ENCOUNTER — Encounter: Payer: Self-pay | Admitting: Family Medicine

## 2023-11-18 ENCOUNTER — Telehealth: Payer: Self-pay | Admitting: *Deleted

## 2023-11-18 NOTE — Telephone Encounter (Signed)
 Lvm to call and reschedule her lab and appt with the new provider, will also send a mychart message. Please assist in getting these rescheduled if she calls back.

## 2023-11-22 NOTE — Telephone Encounter (Signed)
 I have rescheduled your lab and office visit.  Your lab is on 12/23/23 @ 9:00am this is FASTING.  Appointment with provider is on 12/30/23 @ 9:30am with [place].  If this does not work please call the office at [phone] and we can get these rescheduled.

## 2023-12-13 ENCOUNTER — Other Ambulatory Visit: Payer: Self-pay

## 2023-12-13 DIAGNOSIS — Z6836 Body mass index (BMI) 36.0-36.9, adult: Secondary | ICD-10-CM

## 2023-12-13 DIAGNOSIS — I10 Essential (primary) hypertension: Secondary | ICD-10-CM

## 2023-12-13 DIAGNOSIS — E785 Hyperlipidemia, unspecified: Secondary | ICD-10-CM

## 2023-12-13 DIAGNOSIS — Z130 Encounter for screening for diseases of the blood and blood-forming organs and certain disorders involving the immune mechanism: Secondary | ICD-10-CM

## 2023-12-23 ENCOUNTER — Other Ambulatory Visit: Payer: 59

## 2023-12-23 ENCOUNTER — Other Ambulatory Visit

## 2023-12-23 DIAGNOSIS — I10 Essential (primary) hypertension: Secondary | ICD-10-CM

## 2023-12-23 DIAGNOSIS — Z6836 Body mass index (BMI) 36.0-36.9, adult: Secondary | ICD-10-CM

## 2023-12-23 DIAGNOSIS — Z130 Encounter for screening for diseases of the blood and blood-forming organs and certain disorders involving the immune mechanism: Secondary | ICD-10-CM

## 2023-12-23 DIAGNOSIS — E785 Hyperlipidemia, unspecified: Secondary | ICD-10-CM

## 2023-12-24 ENCOUNTER — Ambulatory Visit: Payer: Self-pay

## 2023-12-24 LAB — CBC WITH DIFFERENTIAL/PLATELET
Basophils Absolute: 0 x10E3/uL (ref 0.0–0.2)
Basos: 1 %
EOS (ABSOLUTE): 0.1 x10E3/uL (ref 0.0–0.4)
Eos: 1 %
Hematocrit: 48.7 % — ABNORMAL HIGH (ref 34.0–46.6)
Hemoglobin: 15.9 g/dL (ref 11.1–15.9)
Immature Grans (Abs): 0 x10E3/uL (ref 0.0–0.1)
Immature Granulocytes: 0 %
Lymphocytes Absolute: 2.1 x10E3/uL (ref 0.7–3.1)
Lymphs: 28 %
MCH: 30.4 pg (ref 26.6–33.0)
MCHC: 32.6 g/dL (ref 31.5–35.7)
MCV: 93 fL (ref 79–97)
Monocytes Absolute: 0.4 x10E3/uL (ref 0.1–0.9)
Monocytes: 5 %
Neutrophils Absolute: 4.8 x10E3/uL (ref 1.4–7.0)
Neutrophils: 65 %
Platelets: 211 x10E3/uL (ref 150–450)
RBC: 5.23 x10E6/uL (ref 3.77–5.28)
RDW: 12.7 % (ref 11.7–15.4)
WBC: 7.4 x10E3/uL (ref 3.4–10.8)

## 2023-12-24 LAB — COMPREHENSIVE METABOLIC PANEL WITH GFR
ALT: 18 IU/L (ref 0–32)
AST: 23 IU/L (ref 0–40)
Albumin: 4.9 g/dL (ref 3.9–4.9)
Alkaline Phosphatase: 72 IU/L (ref 44–121)
BUN/Creatinine Ratio: 25 (ref 12–28)
BUN: 18 mg/dL (ref 8–27)
Bilirubin Total: 0.5 mg/dL (ref 0.0–1.2)
CO2: 21 mmol/L (ref 20–29)
Calcium: 10.3 mg/dL (ref 8.7–10.3)
Chloride: 103 mmol/L (ref 96–106)
Creatinine, Ser: 0.72 mg/dL (ref 0.57–1.00)
Globulin, Total: 2.2 g/dL (ref 1.5–4.5)
Glucose: 93 mg/dL (ref 70–99)
Potassium: 4.9 mmol/L (ref 3.5–5.2)
Sodium: 144 mmol/L (ref 134–144)
Total Protein: 7.1 g/dL (ref 6.0–8.5)
eGFR: 93 mL/min/1.73 (ref >59)

## 2023-12-24 LAB — HEMOGLOBIN A1C
Est. average glucose Bld gHb Est-mCnc: 108 mg/dL
Hgb A1c MFr Bld: 5.4 % (ref 4.8–5.6)

## 2023-12-24 LAB — LIPID PANEL
Chol/HDL Ratio: 3.1 ratio (ref 0.0–4.4)
Cholesterol, Total: 212 mg/dL — ABNORMAL HIGH (ref 100–199)
HDL: 69 mg/dL (ref >39)
LDL Chol Calc (NIH): 123 mg/dL — ABNORMAL HIGH (ref 0–99)
Triglycerides: 112 mg/dL (ref 0–149)
VLDL Cholesterol Cal: 20 mg/dL (ref 5–40)

## 2023-12-24 LAB — TSH: TSH: 0.981 u[IU]/mL (ref 0.450–4.500)

## 2023-12-30 ENCOUNTER — Ambulatory Visit (INDEPENDENT_AMBULATORY_CARE_PROVIDER_SITE_OTHER)

## 2023-12-30 ENCOUNTER — Ambulatory Visit: Payer: 59 | Admitting: Family Medicine

## 2023-12-30 VITALS — BP 91/52 | HR 63 | Temp 97.4°F | Ht 64.0 in | Wt 154.1 lb

## 2023-12-30 DIAGNOSIS — I10 Essential (primary) hypertension: Secondary | ICD-10-CM

## 2023-12-30 DIAGNOSIS — E785 Hyperlipidemia, unspecified: Secondary | ICD-10-CM | POA: Diagnosis not present

## 2023-12-30 NOTE — Assessment & Plan Note (Signed)
 Last lipid panel: LDL 123, HDL 69, Trig 112. HDL and Trig have improved greatly. The 10-year ASCVD risk score (Arnett DK, et al., 2019) is: 3.6% Recommend supplements such as Co-Q-10 and Red Yeast Rice, both of which patient says she has at home because her husband also takes them. Will recheck lipid panel in 6 months. Given patient's history of pancreatitis, if triglycerides rise above normal range in the future, strongly recommend starting low dose statin medication at that time.

## 2023-12-30 NOTE — Patient Instructions (Signed)
 It was nice to see you today!  As we discussed in clinic:  -Tonight, start splitting your blood pressure pill in half from 10 mg to 5 mg nightly. I am sending you home with a blood pressure log so that you can monitor blood pressure at home. If it is still running low, we can decrease to a 2.5 mg tablet or just discontinue the blood pressure medication all together. Please keep me updated via MyChart on your blood pressure readings. -I recommend a Co-Q-10 and/or red yeast rice supplement to support cholesterol levels.   I will see you back in 6 months for a physical! It was nice to meet you!  If you have any problems before your next visit feel free to message me via MyChart (minor issues or questions) or call the office, otherwise you may reach out to schedule an office visit.  Thank you! Saddie Sacks, PA-C

## 2023-12-30 NOTE — Progress Notes (Signed)
 Established Patient Office Visit  Subjective   Patient ID: Vanessa Rhodes, female    DOB: [birth year]  Age: 63 y.o. MRN: [id number]  Chief Complaint  Patient presents with   Medical Management of Chronic Issues    HPI  Vanessa Rhodes is a 63 y.o. y/o female who presents to the clinic today for follow up on HTN, HLD.  HTN:  Patient currently taking lisinopril  10 mg for control of their blood pressure. Reports excellence compliance with this medication. Denies side effects. After recovering from pancreatitis several months ago, patient has lost over 50 lbs through diet and lifestyle changes. Reports that as she has lost weight, she has noticed that her blood pressure has been running very low at home (90/50's and lower). She is sometimes symptomatic with dizziness/weakness upon standing. Denies CP, SOB, Palpitations, vision changes, HA, or edema.  HLD: Patient not currently taking lipid lowering medications. Managing with diet and lifestyle changes currently. The 10-year ASCVD risk score (Arnett DK, et al., 2019) is: 3.6%     ROS Per HPI.    Objective:     BP (!) 91/52   Pulse 63   Temp (!) 97.4 F (36.3 C) (Oral)   Ht 5' 4 (1.626 m)   Wt 154 lb 1.9 oz (69.9 kg)   SpO2 99%   BMI 26.45 kg/m    Physical Exam Constitutional:      General: She is not in acute distress.    Appearance: Normal appearance.  Cardiovascular:     Rate and Rhythm: Normal rate and regular rhythm.     Heart sounds: Normal heart sounds. No murmur heard.    No friction rub. No gallop.  Pulmonary:     Effort: Pulmonary effort is normal. No respiratory distress.     Breath sounds: Normal breath sounds.  Musculoskeletal:        General: No swelling.  Skin:    General: Skin is warm and dry.  Neurological:     General: No focal deficit present.     Mental Status: She is alert.  Psychiatric:        Mood and Affect: Mood normal.        Behavior: Behavior normal.        Thought Content: Thought  content normal.      No results found for any visits on 12/30/23.  Last CBC Lab Results  Component Value Date   WBC 7.4 12/23/2023   HGB 15.9 12/23/2023   HCT 48.7 (H) 12/23/2023   MCV 93 12/23/2023   MCH 30.4 12/23/2023   RDW 12.7 12/23/2023   PLT 211 12/23/2023   Last metabolic panel Lab Results  Component Value Date   GLUCOSE 93 12/23/2023   NA 144 12/23/2023   K 4.9 12/23/2023   CL 103 12/23/2023   CO2 21 12/23/2023   BUN 18 12/23/2023   CREATININE 0.72 12/23/2023   EGFR 93 12/23/2023   CALCIUM 10.3 12/23/2023   PHOS 2.5 03/09/2023   PROT 7.1 12/23/2023   ALBUMIN 4.9 12/23/2023   LABGLOB 2.2 12/23/2023   AGRATIO 2.5 (H) 06/25/2022   BILITOT 0.5 12/23/2023   ALKPHOS 72 12/23/2023   AST 23 12/23/2023   ALT 18 12/23/2023   ANIONGAP 11 03/11/2023   Last lipids Lab Results  Component Value Date   CHOL 212 (H) 12/23/2023   HDL 69 12/23/2023   LDLCALC 123 (H) 12/23/2023   TRIG 112 12/23/2023   CHOLHDL 3.1 12/23/2023   Last hemoglobin  A1c Lab Results  Component Value Date   HGBA1C 5.4 12/23/2023   Last thyroid  functions Lab Results  Component Value Date   TSH 0.981 12/23/2023   Last vitamin D No results found for: 25OHVITD2, 25OHVITD3, VD25OH    The 10-year ASCVD risk score (Arnett DK, et al., 2019) is: 3.6%    Assessment & Plan:   Essential hypertension Assessment & Plan: BP goal < 130/80. BP running consistently low at 90/50's or less. Given patients drastic weight loss since October (50+ lbs s/p pancreatitis and subsequent lifestyle changes), suspect that her need for BP support has lessened over time. Will decrease lisinopril  from 10 mg daily to 5 mg daily for now. Advised patient to monitor BP at home and notify if readings have not improved with lower dose of lisinopril . Can consider lowering to 2.5 mg or discontinuing medication all together if readings do not improve. CMP WNL. Will follow up again in person in 6  months.   Hyperlipidemia LDL goal <100 Assessment & Plan: Last lipid panel: LDL 123, HDL 69, Trig 112. HDL and Trig have improved greatly. The 10-year ASCVD risk score (Arnett DK, et al., 2019) is: 3.6% Recommend supplements such as Co-Q-10 and Red Yeast Rice, both of which patient says she has at home because her husband also takes them. Will recheck lipid panel in 6 months. Given patient's history of pancreatitis, if triglycerides rise above normal range in the future, strongly recommend starting low dose statin medication at that time.    Return in about 6 months (around 07/01/2024) for Physical.    Saddie JULIANNA Sacks, PA-C

## 2023-12-30 NOTE — Assessment & Plan Note (Signed)
 BP goal < 130/80. BP running consistently low at 90/50's or less. Given patients drastic weight loss since October (50+ lbs s/p pancreatitis and subsequent lifestyle changes), suspect that her need for BP support has lessened over time. Will decrease lisinopril  from 10 mg daily to 5 mg daily for now. Advised patient to monitor BP at home and notify if readings have not improved with lower dose of lisinopril . Can consider lowering to 2.5 mg or discontinuing medication all together if readings do not improve. CMP WNL. Will follow up again in person in 6 months.

## 2024-01-25 ENCOUNTER — Other Ambulatory Visit: Payer: Self-pay | Admitting: Family Medicine

## 2024-01-25 DIAGNOSIS — I10 Essential (primary) hypertension: Secondary | ICD-10-CM

## 2024-05-06 ENCOUNTER — Encounter (INDEPENDENT_AMBULATORY_CARE_PROVIDER_SITE_OTHER): Payer: Self-pay

## 2024-06-01 ENCOUNTER — Ambulatory Visit: Payer: Self-pay

## 2024-06-01 NOTE — Telephone Encounter (Signed)
 FYI Only or Action Required?: FYI only for provider: appointment scheduled on 12/30.  Patient was last seen in primary care on 12/30/2023 by Gayle Saddie FALCON, PA-C.  Called Nurse Triage reporting Dysuria.  Symptoms began yesterday.  Interventions attempted: OTC medications: Tylenol .  Symptoms are: gradually worsening.  Triage Disposition: See Physician Within 24 Hours  Patient/caregiver understands and will follow disposition?: Yes    Yesterday onset of urinary frequency with 7/10 burning pain with urination. No blood. Mild headache. Temp 99.6 F. Small amount rusty brown vaginal discharge. Scheduled appt with different provider at home office d/t no PCP availability within timeframe. Advised UC or ED for worsening symptoms.    Copied from CRM #[id number]. Topic: Clinical - Red Word Triage >> Jun 01, 2024  2:37 PM Gustabo D wrote: Pt thinks she has a UTI started yesterday morning and got bad/worse today Reason for Disposition  Age > 50 years  Answer Assessment - Initial Assessment Questions 1. SEVERITY: How bad is the pain?  (e.g., Scale 1-10; mild, moderate, or severe)     7/10  2. FREQUENCY: How many times have you had painful urination today?      Going frequently  3. PATTERN: Is pain present every time you urinate or just sometimes?      Every time  4. ONSET: When did the painful urination start?      Yesterday  5. FEVER: Do you have a fever? If Yes, ask: What is your temperature, how was it measured, and when did it start?     99.6 F  6. PAST UTI: Have you had a urine infection before? If Yes, ask: When was the last time? and What happened that time?      Yes, similar symptoms. Took abxs.   7. CAUSE: What do you think is causing the painful urination?  (e.g., UTI, scratch, Herpes sore)     UTI  8. OTHER SYMPTOMS: Do you have any other symptoms? (e.g., blood in urine, flank pain, genital sores, urgency, vaginal discharge)     Urinary frequency,  small amount of rusty brown discharge. Mild headache.  Protocols used: Urination Pain - Female-A-AH

## 2024-06-02 ENCOUNTER — Encounter: Payer: Self-pay | Admitting: Family Medicine

## 2024-06-02 ENCOUNTER — Ambulatory Visit: Admitting: Family Medicine

## 2024-06-02 VITALS — BP 122/68 | HR 68 | Temp 98.5°F | Ht 64.0 in | Wt 169.4 lb

## 2024-06-02 DIAGNOSIS — Z23 Encounter for immunization: Secondary | ICD-10-CM | POA: Diagnosis not present

## 2024-06-02 DIAGNOSIS — N390 Urinary tract infection, site not specified: Secondary | ICD-10-CM | POA: Insufficient documentation

## 2024-06-02 LAB — POCT URINALYSIS DIP (CLINITEK)
Bilirubin, UA: NEGATIVE
Glucose, UA: NEGATIVE mg/dL
Ketones, POC UA: NEGATIVE mg/dL
Nitrite, UA: NEGATIVE
POC PROTEIN,UA: NEGATIVE
Spec Grav, UA: 1.01
Urobilinogen, UA: 0.2 U/dL
pH, UA: 7

## 2024-06-02 MED ORDER — SULFAMETHOXAZOLE-TRIMETHOPRIM 800-160 MG PO TABS: 1.0000 | ORAL_TABLET | Freq: Two times a day (BID) | ORAL | 0 refills | Status: AC

## 2024-06-02 NOTE — Patient Instructions (Signed)
" °  VISIT SUMMARY: You visited us  today due to symptoms of a urinary tract infection, including pelvic pain, increased urinary frequency, and a low-grade fever. A urinalysis confirmed the infection, and you were treated accordingly.  YOUR PLAN: ACUTE UNCOMPLICATED URINARY TRACT INFECTION: You have a urinary tract infection, confirmed by your urinalysis. -You have been prescribed Bactrim to take twice daily for 7 days. -If you experience symptoms after completing the antibiotics, please contact us  for a potential Diflucan prescription. -If your symptoms do not improve after 48 hours or if they worsen, please contact us  immediately.  GENERAL HEALTH MAINTENANCE: You requested and received a flu shot during your visit. -You have received your flu shot today.    "

## 2024-06-02 NOTE — Progress Notes (Signed)
" ° °  Established Patient Office Visit  Subjective   Patient ID: Vanessa Rhodes, female    DOB: [birth year]  Age: 63 y.o. MRN: [id number]  Chief Complaint  Patient presents with   Urinary Tract Infection     History of Present Illness   Vanessa Rhodes is a 63 year old female who presents with symptoms of a urinary tract infection.  She has been experiencing symptoms of a urinary tract infection since early Sunday morning, including pelvic pain and increased urinary frequency, which were particularly severe yesterday. She also notes a low-grade fever of 99.17F, which is elevated for her as she typically runs a lower body temperature around 89F.  No history of kidney stones. She has not experienced vomiting, although she mentions some mild nausea. She recalls having a urinary tract infection approximately 37 years ago but has not had issues since then.  She has no known intolerance to antibiotics. Her urinalysis showed small blood and leukocyte esterase presence.          The 10-year ASCVD risk score (Arnett DK, et al., 2019) is: 6.4%  Health Maintenance Due  Topic Date Due   Medicare Annual Wellness (AWV)  Never done   Cervical Cancer Screening (HPV/Pap Cotest)  Never done   Mammogram  Never done   Pneumococcal Vaccine: 50+ Years (1 of 1 - PCV) Never done   Zoster Vaccines- Shingrix (1 of 2) Never done   Bone Density Scan  Never done   Influenza Vaccine  01/03/2024   COVID-19 Vaccine (1 - 2025-26 season) Never done      Objective:     BP 122/68   Pulse 68   Temp 98.5 F (36.9 C)   Ht 5' 4 (1.626 m)   Wt 169 lb 6.4 oz (76.8 kg)   SpO2 99%   BMI 29.08 kg/m    Physical Exam     Gen: alert, oriented Pulm: no respiratory distress Msk: no cva tenderness Psych: pleasant affect       No results found for any visits on 06/02/24.      Assessment & Plan:   Complicated UTI (urinary tract infection) Assessment & Plan: Urinalysis showed hgb and large LE.   Decision to treat with 7d Bactrim due to additional symptoms of nausea, mild fever. - Prescribed Bactrim for 7 days, twice daily. - Advised to report post-antibiotic symptoms for potential Diflucan prescription. - Instructed to contact if symptoms do not improve after 48 hours or worsen.   Other orders -     Flu vaccine HIGH DOSE PF(Fluzone Trivalent) -     Sulfamethoxazole-Trimethoprim; Take 1 tablet by mouth 2 (two) times daily.  Dispense: 14 tablet; Refill: 0         Return if symptoms worsen or fail to improve.    Toribio MARLA Slain, MD  "

## 2024-06-02 NOTE — Assessment & Plan Note (Signed)
 Urinalysis showed hgb and large LE.  Decision to treat with 7d Bactrim due to additional symptoms of nausea, mild fever. - Prescribed Bactrim for 7 days, twice daily. - Advised to report post-antibiotic symptoms for potential Diflucan prescription. - Instructed to contact if symptoms do not improve after 48 hours or worsen.

## 2024-06-29 ENCOUNTER — Other Ambulatory Visit

## 2024-07-06 ENCOUNTER — Encounter

## 2024-07-14 ENCOUNTER — Ambulatory Visit

## 2024-07-20 ENCOUNTER — Other Ambulatory Visit

## 2024-07-28 ENCOUNTER — Encounter
